# Patient Record
Sex: Female | Born: 1955 | Race: Black or African American | Hispanic: No | Marital: Married | Smoking: Current every day smoker
Health system: Southern US, Community
[De-identification: ages and names within clinical notes are randomized; demographics above are authoritative.]

## PROBLEM LIST (undated history)

## (undated) DIAGNOSIS — M069 Rheumatoid arthritis, unspecified: Secondary | ICD-10-CM

## (undated) HISTORY — PX: TUBAL LIGATION: SHX77

---

## 1987-07-11 HISTORY — PX: VAGINAL HYSTERECTOMY: SUR661

## 2009-07-29 ENCOUNTER — Encounter (INDEPENDENT_AMBULATORY_CARE_PROVIDER_SITE_OTHER): Payer: Self-pay | Admitting: *Deleted

## 2009-07-29 ENCOUNTER — Ambulatory Visit: Payer: Self-pay | Admitting: Family Medicine

## 2009-07-29 DIAGNOSIS — R5381 Other malaise: Secondary | ICD-10-CM | POA: Insufficient documentation

## 2009-07-29 DIAGNOSIS — F17200 Nicotine dependence, unspecified, uncomplicated: Secondary | ICD-10-CM

## 2009-07-29 DIAGNOSIS — M2550 Pain in unspecified joint: Secondary | ICD-10-CM | POA: Insufficient documentation

## 2009-07-29 DIAGNOSIS — R5383 Other fatigue: Secondary | ICD-10-CM

## 2009-07-29 DIAGNOSIS — J45991 Cough variant asthma: Secondary | ICD-10-CM

## 2009-07-30 ENCOUNTER — Encounter: Payer: Self-pay | Admitting: Family Medicine

## 2009-07-30 LAB — CONVERTED CEMR LAB
ALT: 13 units/L (ref 0–35)
AST: 17 units/L (ref 0–37)
Albumin: 4.1 g/dL (ref 3.5–5.2)
Alkaline Phosphatase: 116 units/L (ref 39–117)
Basophils Absolute: 0 10*3/uL (ref 0.0–0.1)
Bilirubin, Direct: 0.1 mg/dL (ref 0.0–0.3)
CO2: 27 meq/L (ref 19–32)
Calcium: 9.2 mg/dL (ref 8.4–10.5)
Cholesterol: 194 mg/dL (ref 0–200)
Creatinine, Ser: 0.6 mg/dL (ref 0.4–1.2)
Eosinophils Absolute: 0.2 10*3/uL (ref 0.0–0.7)
GFR calc non Af Amer: 134.2 mL/min (ref >60)
HCT: 41.4 % (ref 36.0–46.0)
HDL: 35.9 mg/dL — ABNORMAL LOW (ref >39.00)
Hemoglobin: 13.3 g/dL (ref 12.0–15.0)
Lymphocytes Relative: 18.3 % (ref 12.0–46.0)
Lymphs Abs: 1.9 10*3/uL (ref 0.7–4.0)
MCHC: 32.1 g/dL (ref 30.0–36.0)
Monocytes Relative: 7.1 % (ref 3.0–12.0)
Neutro Abs: 7.4 10*3/uL (ref 1.4–7.7)
Platelets: 412 10*3/uL — ABNORMAL HIGH (ref 150.0–400.0)
RDW: 12.3 % (ref 11.5–14.6)
Rhuematoid fact SerPl-aCnc: 40.2 intl units/mL — ABNORMAL HIGH (ref 0.0–20.0)
Sodium: 138 meq/L (ref 135–145)
Total CHOL/HDL Ratio: 5
Total Protein: 8 g/dL (ref 6.0–8.3)
Triglycerides: 184 mg/dL — ABNORMAL HIGH (ref 0.0–149.0)

## 2009-08-04 ENCOUNTER — Other Ambulatory Visit: Admission: RE | Admit: 2009-08-04 | Discharge: 2009-08-04 | Payer: Self-pay | Admitting: Family Medicine

## 2009-08-04 ENCOUNTER — Ambulatory Visit: Payer: Self-pay | Admitting: Family Medicine

## 2009-08-06 ENCOUNTER — Encounter (INDEPENDENT_AMBULATORY_CARE_PROVIDER_SITE_OTHER): Payer: Self-pay | Admitting: *Deleted

## 2009-08-10 ENCOUNTER — Encounter (INDEPENDENT_AMBULATORY_CARE_PROVIDER_SITE_OTHER): Payer: Self-pay | Admitting: *Deleted

## 2009-08-10 LAB — CONVERTED CEMR LAB: Pap Smear: NEGATIVE

## 2009-08-11 ENCOUNTER — Encounter: Payer: Self-pay | Admitting: Family Medicine

## 2009-09-23 ENCOUNTER — Encounter (INDEPENDENT_AMBULATORY_CARE_PROVIDER_SITE_OTHER): Payer: Self-pay | Admitting: *Deleted

## 2009-09-24 ENCOUNTER — Ambulatory Visit: Payer: Self-pay | Admitting: Gastroenterology

## 2009-10-04 ENCOUNTER — Ambulatory Visit: Payer: Self-pay | Admitting: Gastroenterology

## 2009-10-22 ENCOUNTER — Encounter: Payer: Self-pay | Admitting: Family Medicine

## 2009-10-25 ENCOUNTER — Encounter: Payer: Self-pay | Admitting: Family Medicine

## 2009-12-02 ENCOUNTER — Encounter: Payer: Self-pay | Admitting: Family Medicine

## 2009-12-13 ENCOUNTER — Ambulatory Visit: Payer: Self-pay | Admitting: Family Medicine

## 2009-12-13 DIAGNOSIS — M069 Rheumatoid arthritis, unspecified: Secondary | ICD-10-CM

## 2009-12-13 LAB — CONVERTED CEMR LAB
AST: 17 units/L (ref 0–37)
Albumin: 4.3 g/dL (ref 3.5–5.2)
Alkaline Phosphatase: 99 units/L (ref 39–117)
BUN: 7 mg/dL (ref 6–23)
Basophils Relative: 0.4 % (ref 0.0–3.0)
CO2: 31 meq/L (ref 19–32)
Calcium: 9.2 mg/dL (ref 8.4–10.5)
Eosinophils Relative: 1.9 % (ref 0.0–5.0)
Glucose, Bld: 87 mg/dL (ref 70–99)
HCT: 40 % (ref 36.0–46.0)
Hemoglobin: 13.7 g/dL (ref 12.0–15.0)
Lymphs Abs: 2.1 10*3/uL (ref 0.7–4.0)
MCV: 88.8 fL (ref 78.0–100.0)
Monocytes Absolute: 0.8 10*3/uL (ref 0.1–1.0)
Monocytes Relative: 10.8 % (ref 3.0–12.0)
Platelets: 350 10*3/uL (ref 150.0–400.0)
RBC: 4.51 M/uL (ref 3.87–5.11)
Sodium: 142 meq/L (ref 135–145)
TSH: 1.97 microintl units/mL (ref 0.35–5.50)
Total Protein: 6.9 g/dL (ref 6.0–8.3)
WBC: 7.4 10*3/uL (ref 4.5–10.5)

## 2010-01-31 ENCOUNTER — Encounter: Payer: Self-pay | Admitting: Family Medicine

## 2010-02-28 ENCOUNTER — Telehealth: Payer: Self-pay | Admitting: Family Medicine

## 2010-03-01 ENCOUNTER — Ambulatory Visit: Payer: Self-pay | Admitting: Family Medicine

## 2010-03-01 DIAGNOSIS — J019 Acute sinusitis, unspecified: Secondary | ICD-10-CM | POA: Insufficient documentation

## 2010-06-13 ENCOUNTER — Ambulatory Visit: Payer: Self-pay | Admitting: Family Medicine

## 2010-06-13 DIAGNOSIS — F323 Major depressive disorder, single episode, severe with psychotic features: Secondary | ICD-10-CM

## 2010-08-09 NOTE — Letter (Signed)
Summary: [facility] Instructions  [facility]  [address] [place], [place] [address]   Phone: [phone]  Fax: [phone]       Brittney Miles    01/09/1956    MRN: [id number]        Procedure Day /Date:  Monday 10/04/2009     Arrival Time: 8:30 am      Procedure Time: 9:30 am     Location of Procedure:                    _x _  [facility] (4th Floor)                        PREPARATION FOR COLONOSCOPY WITH MOVIPREP   Starting 5 days prior to your procedure Wednesday 3/23 do not eat nuts, seeds, popcorn, corn, beans, peas,  salads, or any raw vegetables.  Do not take any fiber supplements (e.g. Metamucil, Citrucel, and Benefiber).  THE DAY BEFORE YOUR PROCEDURE         DATE: Sunday 3/27  1.  Drink clear liquids the entire day-NO SOLID FOOD  2.  Do not drink anything colored red or purple.  Avoid juices with pulp.  No orange juice.  3.  Drink at least 64 oz. (8 glasses) of fluid/clear liquids during the day to prevent dehydration and help the prep work efficiently.  CLEAR LIQUIDS INCLUDE: Water Jello Ice Popsicles Tea (sugar ok, no milk/cream) Powdered fruit flavored drinks Coffee (sugar ok, no milk/cream) Gatorade Juice: apple, white grape, white cranberry  Lemonade Clear bullion, consomm, broth Carbonated beverages (any kind) Strained chicken noodle soup Hard Candy                             4.  In the morning, mix first dose of MoviPrep solution:    Empty 1 Pouch A and 1 Pouch B into the disposable container    Add lukewarm drinking water to the top line of the container. Mix to dissolve    Refrigerate (mixed solution should be used within 24 hrs)  5.  Begin drinking the prep at 5:00 p.m. The MoviPrep container is divided by 4 marks.   Every 15 minutes drink the solution down to the next mark (approximately 8 oz) until the full liter is complete.   6.  Follow completed prep with 16 oz of clear liquid of your choice (Nothing  red or purple).  Continue to drink clear liquids until bedtime.  7.  Before going to bed, mix second dose of MoviPrep solution:    Empty 1 Pouch A and 1 Pouch B into the disposable container    Add lukewarm drinking water to the top line of the container. Mix to dissolve    Refrigerate  THE DAY OF YOUR PROCEDURE      DATE: Monday 3/28  Beginning at 4:30 a.m. (5 hours before procedure):         1. Every 15 minutes, drink the solution down to the next mark (approx 8 oz) until the full liter is complete.  2. Follow completed prep with 16 oz. of clear liquid of your choice.    3. You may drink clear liquids until 7:30 am (2 HOURS BEFORE PROCEDURE).   MEDICATION INSTRUCTIONS  Unless otherwise instructed, you should take regular prescription medications with a small sip of water   as early as possible the morning of  your procedure.           OTHER INSTRUCTIONS  You will need a responsible adult at least 55 years of age to accompany you and drive you home.   This person must remain in the waiting room during your procedure.  Wear loose fitting clothing that is easily removed.  Leave jewelry and other valuables at home.  However, you may wish to bring a book to read or  an iPod/MP3 player to listen to music as you wait for your procedure to start.  Remove all body piercing jewelry and leave at home.  Total time from sign-in until discharge is approximately 2-3 hours.  You should go home directly after your procedure and rest.  You can resume normal activities the  day after your procedure.  The day of your procedure you should not:   Drive   Make legal decisions   Operate machinery   Drink alcohol   Return to work  You will receive specific instructions about eating, activities and medications before you leave.    The above instructions have been reviewed and explained to me by   Ezra Sites RN  September 24, 2009 3:13 PM     I fully understand and can  verbalize these instructions _____________________________ Date _________

## 2010-08-09 NOTE — Letter (Signed)
Summary: Results Follow up Letter  [place] [facility]  [address] [place], [place] [address]   Phone: [phone]  Fax: [phone]    12/13/2009 MRN: [id number]  [facility] [address] [place], [place]  [address]  Dear Brittney Miles,  The following are the results of your recent test(s):  Test         Result    Pap Smear:        Normal _____  Not Normal _____ Comments: ______________________________________________________ Cholesterol: LDL(Bad cholesterol):         Your goal is less than:         HDL (Good cholesterol):       Your goal is more than: Comments:  ______________________________________________________ Mammogram:        Normal _____  Not Normal _____ Comments:  ___________________________________________________________________ Hemoccult:        Normal _____  Not normal _______ Comments:    _____________________________________________________________________ Other Tests: Brittney Miles your labs looked great!  You do not have anemia. Your kidney, liver, and thyroid functions are all normal, very reassuring!      We routinely do not discuss normal results over the telephone.  If you desire a copy of the results, or you have any questions about this information we can discuss them at your next office visit.   Sincerely,       Ruthe Mannan, MD

## 2010-08-09 NOTE — Letter (Signed)
Summary: Results Follow up Letter  [place] [facility]  [address] [place], [place] [address]   Phone: [phone]  Fax: [phone]    08/10/2009 MRN: [id number]    [facility] [address] [place], [place]  [address]    Dear Ms. Pascale,  The following are the results of your recent test(s):  Test         Result    Pap Smear:        Normal __X___  Not Normal _____ Comments: ______________________________________________________ Cholesterol: LDL(Bad cholesterol):         Your goal is less than:         HDL (Good cholesterol):       Your goal is more than: Comments:  ______________________________________________________ Mammogram:        Normal _____  Not Normal _____ Comments:  ___________________________________________________________________ Hemoccult:        Normal _____  Not normal _______ Comments:    _____________________________________________________________________ Other Tests:    We routinely do not discuss normal results over the telephone.  If you desire a copy of the results, or you have any questions about this information we can discuss them at your next office visit.   Sincerely,    Ruthe Mannan,  M.D.  TA:lsf

## 2010-08-09 NOTE — Assessment & Plan Note (Signed)
Summary: CPX AND PAP SMEAR   Vital Signs:  Patient profile:   55 year old female Height:      62.5 inches Weight:      133 pounds BMI:     24.02 Temp:     98.7 degrees F oral Pulse rate:   84 / minute Pulse rhythm:   regular BP sitting:   108 / 78  (left arm) Cuff size:   regular  Vitals Entered By: Delilah Shan CMA Duncan Dull) (August 04, 2009 9:19 AM) CC: CPX and Pap   History of Present Illness: 55 yo here for CPE/pap.  Polyarticular joint pain and swelling- started in November.  First MP joint in right hand started to swell, very tender to palpation.  sometimes warm to touch.  Then felt her left elbow swell, very painful with movement, sometimes feels like it's locked.  Very tender to touch.  Arthritis runs in her family, she is unsure what type.  Sometimes right wrist swells as well.  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  Drew labs at first visit last week, RA 40, SED rate 31, uric acid normal.  Started on Mobic, which has helped.  Refered to rhematology.  Tobacco abuse- has smoked 1 ppd x 30 years.  Has never quit but has cut back to 1/2 ppd before.  Really wants to quit now for her grandchildren but she not quite yet ready.  Well Woman- FLP at last office visit. mammogram and colonoscopy both set up for February. G5P5.  Has never had a h/o abnormal pap smears.  Had a partial hyesterectomy 20 years ago for fibroids, has her ovaries but unsure if she still has a cervix.  No longer sexually active with her husband.  Current Medications (verified): 1)  Mobic 15 Mg Tabs (Meloxicam) .Marland Kitchen.. 1 Tab By Mouth Daily. 2)  Advair Diskus 100-50 Mcg/dose Misc (Fluticasone-Salmeterol) .Marland Kitchen.. 1 Puff 2 Times Daily 3)  Ventolin Hfa 108 (90 Base) Mcg/act Aers (Albuterol Sulfate) .... 2 Puff Every 4-6 Hours As Needed Cough/wheeze  Allergies: 1)  ! Penicillin  Past History:  Past Surgical History: Last updated: 08/09/2009 Hysterectomy  partial 1989 Tubal ligation  Family History: Last updated: 08/09/2009 Dad died of MI at 64 Mom alives, has DM, CAD s/p MI at age 76 Sister- ovarian CA at 32  Social History: Last updated: Aug 09, 2009 Moved here from [place]. 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 Moved here from [place]. 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 ENT:  Denies difficulty swallowing. CV:  Denies chest pain or discomfort. Resp:  Denies cough. GI:  Denies abdominal pain and bloody stools. GU:  Denies abnormal vaginal bleeding, discharge, dysuria, genital sores, and hematuria. MS:  Complains of joint pain and joint swelling; denies loss of strength. Derm:  Denies rash. Psych:  Denies anxiety and depression.  Physical Exam  General:  alert and well-developed, NAD.   Eyes:  No corneal or conjunctival inflammation noted. EOMI. Perrla. Funduscopic exam benign, without hemorrhages, exudates or papilledema. Vision grossly normal. Mouth:  Oral mucosa and oropharynx without lesions or exudates.  Teeth in good repair. Neck:  No deformities, masses, or tenderness noted. Breasts:  No mass, nodules, thickening, tenderness, bulging, retraction, inflamation, nipple discharge or skin changes  noted.   Lungs:  normal respiratory effort and no intercostal retractions, scattered occassional exp wheezes, no crackles.   Heart:  Normal rate and regular rhythm. S1 and S2 normal without gallop, murmur, click, rub or other extra sounds. Abdomen:  Bowel sounds positive,abdomen soft and non-tender without masses, organomegaly or hernias noted. Genitalia:  Pelvic Exam:        External: normal female genitalia without lesions or masses        Vagina: normal without lesions or masses        No cervix        Adnexa: normal bimanual  exam without masses or fullness        Uterus: normal by palpation        Pap smear: performed Msk:  sill has limited mobitlity of left elbow, shoulder, seem a little less swollen. Bilateral hands (right>left) still with swollen and tender PIP.  No erythema. Psych:  Cognition and judgment appear intact. Alert and cooperative with normal attention span and concentration. No apparent delusions, illusions, hallucinations    Impression & Recommendations:  Problem # 1:  Preventive Health Care (ICD-V70.0) Reviewed preventive care protocols, scheduled due services, and updated immunizations Discussed nutrition, exercise, diet, and healthy lifestyle.  UTD on all prevention at this point (mammogram and colonscopy scheduled).  Problem # 2:  SCREENING FOR MALIGNANT NEOPLASM OF THE CERVIX (ICD-V76.2) Assessment: New Pap today, has no cervix, explained that we only have to do pelvic exams, not pap smears at this point. 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  Complete Medication List: 1)  Mobic 15 Mg Tabs (Meloxicam) .Marland Kitchen.. 1 tab by mouth daily. 2)  Advair Diskus 100-50 Mcg/dose Misc (Fluticasone-salmeterol) .Marland Kitchen.. 1 puff 2 times daily 3)  Ventolin Hfa 108 (90 Base) Mcg/act Aers (Albuterol sulfate) .... 2 puff every 4-6 hours as needed cough/wheeze  Current Allergies (reviewed today): ! PENICILLIN

## 2010-08-09 NOTE — Miscellaneous (Signed)
Summary: Orders Update  Clinical Lists Changes  Orders: Added new Referral order of Rheumatology Referral (Rheumatology) - Signed 

## 2010-08-09 NOTE — Letter (Signed)
Summary: [facility]   Imported By: Lanelle Bal 12/31/2009 10:13:44  _____________________________________________________________________  External Attachment:    Type:   Image     Comment:   External Document

## 2010-08-09 NOTE — Assessment & Plan Note (Signed)
Summary: DEPRESSION/CLE   Vital Signs:  Patient profile:   55 year old female Height:      62.5 inches Weight:      143.50 pounds BMI:     25.92 Temp:     97.7 degrees F oral Pulse rate:   80 / minute Pulse rhythm:   regular BP sitting:   120 / 80  (left arm) Cuff size:   regular  Vitals Entered By: Linde Gillis CMA Duncan Dull) (June 13, 2010 2:51 PM) CC: depression   History of Present Illness: 55 yo very pleasant female with RA here with her husband to discuss years of worsening depression.  Since 2009, very tearful.  Has days where she will stay in bed for over 24 hours. Also reluctantly admits to seeing shadows of people.  She has seen them for so many years that they no longer scare her.  One she can see very vividly, a man with a belly wearing blue jeans. They never tell her harm anyone but she does separately hear whispering voices.  She usually cannot make out what they are saying. Has not SI or HI but she is "tired" and has often hoped she would die in her sleep so this would stop.  Recently has started doing repetitive things as well.  If she is counting something and it ends on an odd number, she has to start over.  Denies any manic symptoms.  Her daughter and aunt have schizophrenia.   Current Medications (verified): 1)  Advair Diskus 100-50 Mcg/dose Misc (Fluticasone-Salmeterol) .Marland Kitchen.. 1 Puff 2 Times Daily 2)  Ventolin Hfa 108 (90 Base) Mcg/act Aers (Albuterol Sulfate) .... 2 Puff Every 4-6 Hours As Needed Cough/wheeze 3)  Folic Acid 1 Mg Tabs (Folic Acid) .... Take One Tablet By Mouth Daily 4)  Methotrexate Sodium 25 Mg/ml Soln (Methotrexate Sodium) .... Inject 0.8cc/ml Under The Skin Once Every Week 5)  Tessalon Perles 100 Mg Caps (Benzonatate) .Marland Kitchen.. 1 Tab By Mouth Three Times A Day As Needed Cough  Allergies: 1)  ! Penicillin 2)  ! 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  Physical Exam  General:  Well-developed,well-nourished,in no acute distress; alert,appropriate and cooperative throughout examination VSS, non toxic appearing Psych:  Cognition and judgment appear intact. Alert and cooperative with normal attention span and concentration. 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 Several issues going on but I am concerned for schizophrenia and psychotic behavior. Offered to start on medication but patient would prefer to be referred to psychiatry.  She is contracted for safety.  Orders: Psychiatric Referral (Psych)  Complete Medication List:  1)  Advair Diskus 100-50 Mcg/dose Misc (Fluticasone-salmeterol) .Marland Kitchen.. 1 puff 2 times daily 2)  Ventolin Hfa 108 (90 Base) Mcg/act Aers (Albuterol sulfate) .... 2 puff every 4-6 hours as needed cough/wheeze 3)  Folic Acid 1 Mg Tabs (Folic acid) .... Take one tablet by mouth daily 4)  Methotrexate Sodium 25 Mg/ml Soln (Methotrexate sodium) .... Inject 0.8cc/ml under the skin once every week 5)  Tessalon Perles 100 Mg Caps  (Benzonatate) .Marland Kitchen.. 1 tab by mouth three times a day as needed cough  Patient Instructions: 1)  Please see Shirlee Limerick on your way out.   Orders Added: 1)  Psychiatric Referral [Psych] 2)  Est. Patient Level IV [57846]    Current Allergies (reviewed today): ! PENICILLIN ! SULFA

## 2010-08-09 NOTE — Letter (Signed)
Summary: [facility]   Imported By: Lanelle Bal 11/04/2009 13:06:48  _____________________________________________________________________  External Attachment:    Type:   Image     Comment:   External Document

## 2010-08-09 NOTE — Letter (Signed)
Summary: [facility]   Imported By: Lanelle Bal 02/11/2010 13:45:23  _____________________________________________________________________  External Attachment:    Type:   Image     Comment:   External Document

## 2010-08-09 NOTE — Progress Notes (Signed)
Summary: Cold symptoms  Phone Note Call from Patient Call back at [phone]   Caller: Spouse/Stacey Call For: Ruthe Mannan MD Summary of Call: Patient has an appointment scheduled with you tomorrow for cold symptoms. Patient has a cold with productive cough and fever. Patient wants to know what she should take OTC until she sees you tomorrow? Patient called her rheumotologist today and they suggested that she contact you. [facility] Initial call taken by: Sydell Axon LPN,  February 28, 2010 11:22 AM  Follow-up for Phone Call        She can take OTC products like Mucinex. Ruthe Mannan MD  February 28, 2010 11:25 AM  Spoke with patient and she says that she has never taken Mucinex because she tries to stay away from a lot of OTC medications due to RA.  She can take or has taken in the past Robitussin and Vicks 44.  Please advise.  Linde Gillis CMA Duncan Dull)  February 28, 2010 11:50 AM   Additional Follow-up for Phone Call Additional follow up Details #1::        thats ok too. Ruthe Mannan MD  February 28, 2010 11:51 AM  Patient advised as instructed via telephone.  Additional Follow-up by: Linde Gillis CMA Duncan Dull),  February 28, 2010 11:55 AM

## 2010-08-09 NOTE — Assessment & Plan Note (Signed)
Summary: feeling tired, hot flashes   Vital Signs:  Patient profile:   55 year old female Height:      62.5 inches Weight:      137.50 pounds BMI:     24.84 Temp:     98.5 degrees F oral Pulse rate:   68 / minute Pulse rhythm:   regular BP sitting:   110 / 72  (left arm) Cuff size:   regular  Vitals Entered By: Linde Gillis CMA Duncan Dull) (December 13, 2009 8:43 AM) CC: feeling tired, hot flashes   History of Present Illness: 55 yo here for feeling tired, sometimes have chills and sweats.  Has been post menopausal since 1999. Has not felt this way in years.  Started shortly after starting Methotrexate for newly diagnosed RA. Sees Dr. Dareen Piano, joints much less swollen.  Can actually move her elbows! On Methotrexate 25 mg/ml 0.8 cc/ml weekly subq.  Just saw Dr. Dareen Piano last week, sees him every 6 weeks.   Current Medications (verified): 1)  Advair Diskus 100-50 Mcg/dose Misc (Fluticasone-Salmeterol) .Marland Kitchen.. 1 Puff 2 Times Daily 2)  Ventolin Hfa 108 (90 Base) Mcg/act Aers (Albuterol Sulfate) .... 2 Puff Every 4-6 Hours As Needed Cough/wheeze 3)  Folic Acid 1 Mg Tabs (Folic Acid) .... Take One Tablet By Mouth Daily 4)  Methotrexate Sodium 25 Mg/ml Soln (Methotrexate Sodium) .... Inject 0.8cc/ml Under The Skin Once Every Week  Allergies: 1)  ! Penicillin 2)  ! Sulfa  Review of Systems      See HPI General:  Denies fever, weakness, and weight loss. CV:  Denies chest pain or discomfort, difficulty breathing at night, fainting, and fatigue. GI:  Denies diarrhea, nausea, and vomiting. MS:  Denies joint pain, joint redness, joint swelling, and loss of strength.  Physical Exam  General:  Well-developed,well-nourished,in no acute distress; alert,appropriate and cooperative throughout examination Mouth:  Oral mucosa and oropharynx without lesions or exudates.  Teeth in good repair. Lungs:  normal respiratory effort and no intercostal retractions, scattered occassional exp wheezes, no  crackles.   Heart:  Normal rate and regular rhythm. S1 and S2 normal without gallop, murmur, click, rub or other extra sounds. Msk:  FROM in  left elbow, shoulder! No swelling in hands, no erythema!  Psych:  Cognition and judgment appear intact. Alert and cooperative with normal attention span and concentration. No apparent delusions, illusions, hallucinations   Impression & Recommendations:  Problem # 1:  FATIGUE (ICD-780.79) Assessment Deteriorated Time spent with patient 25 minutes, more than 50% of this time was spent counseling patient on possible causes of her fatigue.   RA is known for causing extreme fatigue.  She is also on Methotrexate which can cause many of the symptoms she described.  She was afraid to read about the side effects, we discussed them together.  Will get lab work to rule out other reversible causes.  I have also asked her to make sure Dr. Dareen Piano is sending [place] records.  Orders: Venipuncture ([address]) TLB-CBC Platelet - w/Differential (85025-CBCD) TLB-BMP (Basic Metabolic Panel-BMET) (80048-METABOL) TLB-Hepatic/Liver Function Pnl (80076-HEPATIC) TLB-TSH (Thyroid Stimulating Hormone) (84443-TSH)  Complete Medication List: 1)  Advair Diskus 100-50 Mcg/dose Misc (Fluticasone-salmeterol) .Marland Kitchen.. 1 puff 2 times daily 2)  Ventolin Hfa 108 (90 Base) Mcg/act Aers (Albuterol sulfate) .... 2 puff every 4-6 hours as needed cough/wheeze 3)  Folic Acid 1 Mg Tabs (Folic acid) .... Take one tablet by mouth daily 4)  Methotrexate Sodium 25 Mg/ml Soln (Methotrexate sodium) .... Inject 0.8cc/ml under the  skin once every week  Current Allergies (reviewed today): ! PENICILLIN ! SULFA

## 2010-08-09 NOTE — Assessment & Plan Note (Signed)
Summary: NEW PATIENT TO EST/MK   Vital Signs:  Patient profile:   55 year old female Height:      62.5 inches Weight:      133.50 pounds BMI:     24.12 Temp:     98.5 degrees F oral Pulse rate:   84 / minute Pulse rhythm:   regular BP sitting:   130 / 74  (left arm) Cuff size:   regular  Vitals Entered By: Brittney Miles CMA Duncan Dull) (August 26, 2009 10:58 AM) CC: New Patient to Establish   History of Present Illness: 55 yo here to establish care.  Polyarticular joint pain and swelling- started in November.  First MP joint in right hand started to swell, very tender to palpation.  sometimes warm to touch.  Then felt her left elbow swell, very painful with movement, sometimes feels like it's locked.  Very tender to touch.  Arthritis runs in her family, she is unsure what type.  Sometimes right wrist swells as well.  Fatigue- started about the same time as the joints started swelling (05/2009).  Feels completely tired all the time, diffuclty sleeping as well.  Denies any symptoms of depression but sometimes gets anxious about feeling so tired.  No LE edema, no SOB, no CP.  Has had issues with constipation.  No blood in stool, no abdominal pain.  Tobacco abuse- has smoked 1 ppd x 30 years.  Has never quit but has cut back to 1/2 ppd before.  Really wants to quit now for her grandchildren but she not quite yet ready.  Cough- chronic, worsened by being in cold or exertion.  No wheezing.  No SOB.  Often has a deep morning cough, ongoing for 15 years or so.  Well Woman- needs FLP, Pap, mammogram.    Current Medications (verified): 1)  Mobic 15 Mg Tabs (Meloxicam) .Marland Kitchen.. 1 Tab By Mouth Daily. 2)  Advair Diskus 100-50 Mcg/dose Misc (Fluticasone-Salmeterol) .Marland Kitchen.. 1 Puff 2 Times Daily 3)  Ventolin Hfa 108 (90 Base) Mcg/act Aers (Albuterol Sulfate) .... 2 Puff Every 4-6 Hours As Needed Cough/wheeze  Allergies (verified): 1)  ! Penicillin  Past History:  Family History: Last updated:  August 26, 2009 Dad died of MI at 36 Mom alives, has DM, CAD s/p MI at age 74 Sister- ovarian CA at 3  Social History: Last updated: 08/26/09 Moved here from [place]. 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 Eyes:  Denies blurring. ENT:  Denies difficulty swallowing. CV:  Denies chest pain or discomfort, difficulty breathing at night, difficulty breathing while lying down, fainting, lightheadness, palpitations, and shortness of breath with exertion. Resp:  Complains of cough and sputum productive; denies shortness of breath and wheezing. GI:  Complains of constipation; denies abdominal pain, bloody stools, loss of appetite, nausea, and vomiting. MS:  Complains of joint pain, joint redness, and joint swelling; denies loss of strength. Derm:  Complains of dryness; denies hair loss and rash. Psych:  Complains of anxiety; denies  depression, easily angered, easily tearful, irritability, mental problems, panic attacks, sense of great danger, suicidal thoughts/plans, and thoughts of violence. Endo:  Complains of cold intolerance; denies heat intolerance and weight change. Heme:  Denies abnormal bruising, bleeding, enlarge lymph nodes, fevers, and pallor.  Physical Exam  General:  Well-developed,well-nourished,in no acute distress; alert,appropriate and cooperative throughout examination Eyes:  No corneal or conjunctival inflammation noted. EOMI. Perrla. Funduscopic exam benign, without hemorrhages,  exudates or papilledema. Vision grossly normal. Ears:  External ear exam shows no significant lesions or deformities.  Otoscopic examination reveals clear canals, tympanic membranes are intact bilaterally without bulging, retraction, inflammation or discharge. Hearing is grossly normal bilaterally. Mouth:  Oral mucosa and oropharynx without lesions or exudates.  Teeth in good repair. Neck:  No deformities, masses, or tenderness noted. Lungs:  normal respiratory effort and no intercostal retractions, scattered occassional exp wheezes, no crackles.   Heart:  Normal rate and regular rhythm. S1 and S2 normal without gallop, murmur, click, rub or other extra sounds. Abdomen:  Bowel sounds positive,abdomen soft and non-tender without masses, organomegaly or hernias noted. Skin:  Intact without suspicious lesions or rashes Psych:  Cognition and judgment appear intact. Alert and cooperative with normal attention span and concentration. No apparent delusions, illusions, hallucinations   Shoulder/Elbow Exam  Elbow Exam:    Left:    Inspection:  Abnormal    Palpation:  Abnormal       Location:  right lateral epicondyle    Stability:  stable    Tenderness:  right lateral epicondyle    Swelling:  right lateral epicondyle    Erythema:  right lateral epicondyle   Wrist/Hand Exam  Wrist Exam:    Right:    Inspection:  Abnormal    Palpation:  Abnormal       Location:  left radial head    Stability:  stable    Tenderness:  right radial head    Swelling:  right radial head    Erythema:  right radial head  Hand Exam:    Right:    Inspection:  Abnormal    Palpation:  Abnormal       Location:  1st MCPJ    Tenderness:  1st MCPJ    Swelling:  1st MCPJ    Erythema:  1st MCPJ   Impression & Recommendations:  Problem # 1:  POLYARTHRITIS (ICD-719.49) Assessment New Differential is very wide.  Most likely RA.  Will check uric acid, SED rate, RA factor.  May need referral to ortho/rheum . Time  spent with patient 45 minutes, more than 50% of this time was discussing possible eitologies and further work up.  Pt is very anxious that this may be a progressive illness.  Meloxicam for pain.  Orders: Venipuncture (51761) TLB-Rheumatoid Factor (RA) (60737-TG) TLB-Sedimentation Rate (ESR) (85652-ESR) TLB-Uric Acid, Blood (84550-URIC)  Problem # 2:  FATIGUE (ICD-780.79) Assessment: New Likely related to #1.  Will check CBC, BMET, TSH, B12/Folate to r/o other reversible causes. Orders: Venipuncture (62694) TLB-CBC Platelet - w/Differential (85025-CBCD) TLB-BMP (Basic Metabolic Panel-BMET) (80048-METABOL) TLB-TSH (Thyroid Stimulating Hormone) (84443-TSH) TLB-B12 + Folate Pnl (85462_70350-K93/GHW)  Problem # 3:  TOBACCO ABUSE (ICD-305.1) Assessment: Unchanged Precontemplative stage.  Will continue to discuss with patient.  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   Strongly advised quitting smoking.  given handout on Chantix. Will start Advair daily, along with rescue inhaler.  Schedule PFTs. Her  updated medication list for this problem includes:    Advair Diskus 100-50 Mcg/dose Misc (Fluticasone-salmeterol) .Marland Kitchen... 1 puff 2 times daily    Ventolin Hfa 108 (90 Base) Mcg/act Aers (Albuterol sulfate) .Marland Kitchen... 2 puff every 4-6 hours as needed cough/wheeze  Problem # 5:  OTHER SCREENING MAMMOGRAM (ICD-V76.12) Assessment: Comment Only schedule mammogram today. Orders: Radiology Referral (Radiology)  Problem # 6:  SPECIAL SCREENING FOR MALIGNANT NEOPLASMS COLON (ICD-V76.51) Assessment: Comment Only schedule colonoscopy today. Orders: Radiology Referral (Radiology)  Complete Medication List: 1)  Mobic 15 Mg Tabs (Meloxicam) .Marland Kitchen.. 1 tab by mouth daily. 2)  Advair Diskus 100-50 Mcg/dose Misc (Fluticasone-salmeterol) .Marland Kitchen.. 1 puff 2 times daily 3)  Ventolin Hfa 108 (90 Base)  Mcg/act Aers (Albuterol sulfate) .... 2 puff every 4-6 hours as needed cough/wheeze  Other Orders: TLB-Lipid Panel (80061-LIPID) TLB-Hepatic/Liver Function Pnl (80076-HEPATIC)  Patient Instructions: 1)  Very nice to meet you, Brittney Miles. 2)  Please stop by to see Shirlee Limerick on your way out to set up your colonscopy and mammogram. 3)  Make an appointment for a complete physical pap smear on your way out. 4)  I will call you with your lab results either tomorrow or Monday. Prescriptions: VENTOLIN HFA 108 (90 BASE) MCG/ACT AERS (ALBUTEROL SULFATE) 2 puff every 4-6 hours as needed cough/wheeze  #1 x 0   Entered and Authorized by:   Ruthe Mannan MD   Signed by:   Ruthe Mannan MD on 07/29/2009   Method used:   Electronically to        [facility]. [address] [phone]* (retail)       [address] [place], [place]  [address]       Ph: [phone]       Fax: [phone]   RxID:   [id number] ADVAIR DISKUS 100-50 MCG/DOSE MISC (FLUTICASONE-SALMETEROL) 1 puff 2 times daily  #1 x 0   Entered and Authorized by:   Ruthe Mannan MD   Signed by:   Ruthe Mannan MD on 07/29/2009   Method used:   Electronically to        [facility]. [address] [phone]* (retail)       [address], [place]  [address]       Ph: [phone]       Fax: [phone]   RxID:   [id number] MOBIC 15 MG TABS (MELOXICAM) 1 tab by mouth daily.  #30 x 0   Entered and Authorized by:   Ruthe Mannan MD   Signed by:   Ruthe Mannan MD on 07/29/2009   Method used:   Electronically to        [facility]. [address] [phone]* (retail)       [address], [place]  [address]       Ph: [phone]       Fax: [phone]   RxID:   [id number]   Prior Medications (reviewed today): None Current Allergies (reviewed today): ! PENICILLIN  TD Result Date:  07/25/2007 TD Result:  historical TD Next Due:  10 yr    Past Medical History:    Unremarkable  Past Surgical History:    Hysterectomy partial 1989     Tubal ligation

## 2010-08-09 NOTE — Letter (Signed)
Summary: Results Follow up Letter  [place] [facility]  [address] [place], [place] [address]   Phone: [phone]  Fax: [phone]    08/06/2009 MRN: [id number]    [facility] [address] [place], [place]  [address]    Dear Ms. Harriott,  The following are the results of your recent test(s):  Test         Result    Pap Smear:        Normal __X___  Not Normal _____ Comments:   Repeat in 1-2 years. ______________________________________________________ Cholesterol: LDL(Bad cholesterol):         Your goal is less than:         HDL (Good cholesterol):       Your goal is more than: Comments:  ______________________________________________________ Mammogram:        Normal _____  Not Normal _____ Comments:  ___________________________________________________________________ Hemoccult:        Normal _____  Not normal _______ Comments:    _____________________________________________________________________ Other Tests:    We routinely do not discuss normal results over the telephone.  If you desire a copy of the results, or you have any questions about this information we can discuss them at your next office visit.   Sincerely,    Ruthe Mannan,  M.D.  TA:lsf

## 2010-08-09 NOTE — Letter (Signed)
Summary: Previsit letter  [facility]  [address] [place], [place] [address]   Phone: [phone]  Fax: [phone]       07/29/2009 MRN: [id number]  [facility] [address] [place], [place]  [address]  Dear Ms. Brittney Miles,  Welcome to the Gastroenterology Division at [facility].    You are scheduled to see a nurse for your pre-procedure visit on 08-17-09 at 1:30pm on the 3rd floor at [facility], [address]  We ask that you try to arrive at our office 15 minutes prior to your appointment time to allow for check-in.  Your nurse visit will consist of discussing your medical and surgical history, your immediate family medical history, and your medications.    Please bring a complete list of all your medications or, if you prefer, bring the medication bottles and we will list them.  We will need to be aware of both prescribed and over the counter drugs.  We will need to know exact dosage information as well.  If you are on blood thinners (Coumadin, Plavix, Aggrenox, Ticlid, etc.) please call our office today/prior to your appointment, as we need to consult with your physician about holding your medication.   Please be prepared to read and sign documents such as consent forms, a financial agreement, and acknowledgement forms.  If necessary, and with your consent, a friend or relative is welcome to sit-in on the nurse visit with you.  Please bring your insurance card so that we may make a copy of it.  If your insurance requires a referral to see a specialist, please bring your referral form from your primary care physician.  No co-pay is required for this nurse visit.     If you cannot keep your appointment, please call [phone] to cancel or reschedule prior to your appointment date.  This allows [place] the opportunity to schedule an appointment for another patient in need of care.    Thank you for choosing [facility] for your medical  needs.  We appreciate the opportunity to care for you.  Please visit [place] at our website  to learn more about our practice.                     Sincerely.                                                                                                                   The Gastroenterology Division

## 2010-08-09 NOTE — Miscellaneous (Signed)
Summary: [facility]  Clinical Lists Changes  Medications: Added new medication of MOVIPREP 100 GM  SOLR (PEG-KCL-NACL-NASULF-NA ASC-C) As per prep instructions. - Signed Rx of MOVIPREP 100 GM  SOLR (PEG-KCL-NACL-NASULF-NA ASC-C) As per prep instructions.;  #1 x 0;  Signed;  Entered by: Ezra Sites RN;  Authorized by: Rachael Fee MD;  Method used: Electronically to [facility] [address] [id number]*, [address], [place], [place]  [address], Ph: [phone], Fax: [phone] Allergies: Added new allergy or adverse reaction of SULFA    Prescriptions: MOVIPREP 100 GM  SOLR (PEG-KCL-NACL-NASULF-NA ASC-C) As per prep instructions.  #1 x 0   Entered by:   Ezra Sites RN   Authorized by:   Rachael Fee MD   Signed by:   Ezra Sites RN on 09/24/2009   Method used:   Electronically to        [facility] [address] [phone]* (retail)       [address] [place]  [address]       Ph: [phone]       Fax: [phone]   RxID:   [id number]

## 2010-08-09 NOTE — Assessment & Plan Note (Signed)
Summary: Cold, productive cough   Vital Signs:  Patient profile:   55 year old female Height:      62.5 inches Weight:      141.38 pounds BMI:     25.54 Temp:     98.7 degrees F oral Pulse rate:   68 / minute Pulse rhythm:   regular BP sitting:   130 / 70  (right arm) Cuff size:   regular  Vitals Entered By: Linde Gillis CMA Duncan Dull) (March 01, 2010 8:48 AM) CC: cold   History of Present Illness: 55 yo with h/o RA on MTX here for 1 week of progressive URI symptoms. Started with sneezing and runny nose.   Now has productive cough, sinus pressure. No fevers or SOB. More tired than usual.  Current Medications (verified): 1)  Advair Diskus 100-50 Mcg/dose Misc (Fluticasone-Salmeterol) .Marland Kitchen.. 1 Puff 2 Times Daily 2)  Ventolin Hfa 108 (90 Base) Mcg/act Aers (Albuterol Sulfate) .... 2 Puff Every 4-6 Hours As Needed Cough/wheeze 3)  Folic Acid 1 Mg Tabs (Folic Acid) .... Take One Tablet By Mouth Daily 4)  Methotrexate Sodium 25 Mg/ml Soln (Methotrexate Sodium) .... Inject 0.8cc/ml Under The Skin Once Every Week 5)  Azithromycin 250 Mg  Tabs (Azithromycin) .... 2 By  Mouth Today and Then 1 Daily For 4 Days 6)  Tessalon Perles 100 Mg Caps (Benzonatate) .Marland Kitchen.. 1 Tab By Mouth Three Times A Day As Needed Cough  Allergies: 1)  ! Penicillin 2)  ! 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 ENT:  Complains of nasal congestion, postnasal drainage, sinus pressure, and sore throat. Resp:  Complains of cough and  sputum productive; denies shortness of breath and wheezing.  Physical Exam  General:  Well-developed,well-nourished,in no acute distress; alert,appropriate and cooperative throughout examination VSS, non toxic appearing Ears:  TMs retracted bilaterally Nose:  boggy turbinates, sinuses TTP throughout Mouth:  pharyngeal erythema.   Lungs:  normal respiratory effort and no intercostal retractions, scattered occassional exp wheezes, no crackles.   Heart:  Normal rate and regular rhythm. S1 and S2 normal without gallop, murmur, click, rub or other extra sounds. Psych:  Cognition and judgment appear intact. Alert and cooperative with normal attention span and concentration. No apparent delusions, illusions, hallucinations   Impression & Recommendations:  Problem # 1:  ACUTE SINUSITIS, UNSPECIFIED (ICD-461.9) Assessment New complicated since she is potentially immunocompromised (on MTX). Treat with zpack, tessalon perles for cough. Follow up in 5-7 days of no improvement of symptoms. Her updated medication list for this problem includes:    Azithromycin 250 Mg Tabs (Azithromycin) .Marland Kitchen... 2 by  mouth today and then 1 daily for 4 days    Tessalon Perles 100 Mg Caps (Benzonatate) .Marland Kitchen... 1 tab by mouth three times a day as needed cough  Complete Medication List: 1)  Advair Diskus 100-50 Mcg/dose Misc (Fluticasone-salmeterol) .Marland Kitchen.. 1 puff 2 times daily 2)  Ventolin Hfa 108 (90 Base) Mcg/act Aers (Albuterol sulfate) .... 2 puff every 4-6 hours as needed cough/wheeze 3)  Folic Acid 1 Mg Tabs (Folic acid) .... Take one tablet by mouth daily 4)  Methotrexate Sodium 25 Mg/ml Soln (Methotrexate sodium) .... Inject 0.8cc/ml under the skin once every week 5)  Azithromycin 250 Mg Tabs (Azithromycin) .... 2 by  mouth today and then 1 daily for 4 days 6)  Tessalon Perles 100 Mg Caps (Benzonatate) .Marland Kitchen.. 1 tab by mouth three times a day as needed cough Prescriptions: TESSALON PERLES 100 MG CAPS (BENZONATATE) 1  tab by mouth three times a day as needed cough  #60 x 0   Entered and Authorized by:   Ruthe Mannan MD   Signed by:   Ruthe Mannan MD on 03/01/2010   Method used:   Electronically to        [facility]. [address] [phone]* (retail)       [address] [place], [place]  [address]       Ph: [phone]       Fax: [phone]   RxID:   [id number] AZITHROMYCIN 250 MG  TABS (AZITHROMYCIN) 2 by  mouth today and then 1 daily for 4 days  #6 x 0   Entered and Authorized by:   Ruthe Mannan MD   Signed by:   Ruthe Mannan MD on 03/01/2010   Method used:   Electronically to        [facility]. [address] [phone]* (retail)       [address], [place]  [address]       Ph: [phone]       Fax: [phone]   RxID:   [id number]   Current Allergies (reviewed today): ! PENICILLIN ! SULFA

## 2010-08-09 NOTE — Procedures (Signed)
Summary: Colonoscopy  Patient: Brittney Miles Note: All result statuses are Final unless otherwise noted.  Tests: (1) Colonoscopy (COL)   COL Colonoscopy           DONE     [facility]     [address]     [place], [place]  [address]           COLONOSCOPY PROCEDURE REPORT           PATIENT:  Brittney Miles, Brittney Miles  MR#:  [id number]     BIRTHDATE:  1955-09-07, 53 yrs. old  GENDER:  female     ENDOSCOPIST:  Rachael Fee, MD     REF. BY:  Ruthe Mannan, M.D.     PROCEDURE DATE:  10/04/2009     PROCEDURE:  Colonoscopy, Diagnostic     ASA CLASS:  Class II     INDICATIONS:  Routine Risk Screening     MEDICATIONS:   Fentanyl 75 mcg IV, Versed 8 mg IV     DESCRIPTION OF PROCEDURE:   After the risks benefits and     alternatives of the procedure were thoroughly explained, informed     consent was obtained.  Digital rectal exam was performed and     revealed no rectal masses.   The LB [id number] endoscope     was introduced through the anus and advanced to the cecum, which     was identified by both the appendix and ileocecal valve, without     limitations.  The quality of the prep was good, using MoviPrep.     The instrument was then slowly withdrawn as the colon was fully     examined.     <<PROCEDUREIMAGES>>           FINDINGS:  Mild diverticulosis was found throughout the colon (see     image3).  This was otherwise a normal examination of the colon     (see image2, image1, and image4).   Retroflexed views in the     rectum revealed no abnormalities.    The scope was then withdrawn     from the patient and the procedure completed.           COMPLICATIONS:  None     ENDOSCOPIC IMPRESSION:     1) Mild diverticulosis throughout the colon     2) Otherwise normal examination; no polyps or cancers           RECOMMENDATIONS:     1) Continue current colorectal screening recommendations for     "routine risk" patients with a repeat colonoscopy in 10 years.           REPEAT EXAM:   10 years           ______________________________     Rachael Fee, MD           n.     eSIGNED:   Rachael Fee at 10/04/2009 09:53 AM           Audry Riles, [id number]  Note: An exclamation mark (!) indicates a result that was not dispersed into the flowsheet. Document Creation Date: 10/04/2009 9:54 AM _______________________________________________________________________  (1) Order result status: Final Collection or observation date-time: 10/04/2009 09:46 Requested date-time:  Receipt date-time:  Reported date-time:  Referring Physician:   Ordering Physician: Rob Bunting [phone]) Specimen Source:  Source: Launa Grill Order Number: [id number] Lab site:   Appended Document: Colonoscopy    Clinical Lists Changes  Observations:  Added new observation of COLONNXTDUE: 09/2019 (10/04/2009 13:07)

## 2010-08-09 NOTE — Consult Note (Signed)
Summary: [facility]   Imported By: Lanelle Bal 08/24/2009 08:22:17  _____________________________________________________________________  External Attachment:    Type:   Image     Comment:   External Document

## 2010-10-24 ENCOUNTER — Encounter: Payer: Self-pay | Admitting: Family Medicine

## 2010-10-24 LAB — HM PAP SMEAR

## 2010-10-27 ENCOUNTER — Encounter: Payer: Self-pay | Admitting: Family Medicine

## 2010-10-27 ENCOUNTER — Ambulatory Visit: Payer: Self-pay | Admitting: Family Medicine

## 2010-10-27 ENCOUNTER — Ambulatory Visit (INDEPENDENT_AMBULATORY_CARE_PROVIDER_SITE_OTHER): Payer: 59 | Admitting: Family Medicine

## 2010-10-27 DIAGNOSIS — N63 Unspecified lump in unspecified breast: Secondary | ICD-10-CM

## 2010-10-27 DIAGNOSIS — M069 Rheumatoid arthritis, unspecified: Secondary | ICD-10-CM

## 2010-10-27 DIAGNOSIS — N6320 Unspecified lump in the left breast, unspecified quadrant: Secondary | ICD-10-CM | POA: Insufficient documentation

## 2010-10-27 NOTE — Assessment & Plan Note (Signed)
New.  No mass on exam today. Will get diagnostic mammogram to rule out mass. The patient indicates understanding of these issues and agrees with the plan.

## 2010-10-27 NOTE — Progress Notes (Signed)
55 yo here for left breast mass.  Noticed it last month. Left breast was diffusely swollen and tender, swelling under left arm. ?mass at 6 oclock position. Has now resolved. Noticed it got better when she missed a MTX injection for her RA. Asked Dr. Dareen Piano about it, rheumatologist who did not think it was a side effect from MTX.  Overdue for mammogram.  The PMH, PSH, Social History, Family History, Medications, and allergies have been reviewed in [facility], and have been updated if relevant.   Review of Systems       See HPI General:  Denies fever, weakness, and weight loss. CV:  Denies chest pain or discomfort, difficulty breathing at night, fainting, and fatigue. GI:  Denies diarrhea, nausea, and vomiting. MS:  Denies joint pain, joint redness, joint swelling, and loss of strength.  Physical Exam BP 130/82  Pulse 81  Temp(Src) 98.4 F (36.9 C) (Oral)  Ht 5\' 3"  (1.6 m)  Wt 143 lb 6.4 oz (65.046 kg)  BMI 25.40 kg/m2   General:  Well-developed,well-nourished,in no acute distress; alert,appropriate and cooperative throughout examination Mouth:  Oral mucosa and oropharynx without lesions or exudates.  Teeth in good repair. Lungs:  normal respiratory effort and no intercostal retractions, scattered occassional exp wheezes, no crackles.   Breast:  No palpable masses or deformities in either breast. Heart:  Normal rate and regular rhythm. S1 and S2 normal without gallop, murmur, click, rub or other extra sounds. Msk:  FROM in  left elbow, shoulder! No swelling in hands, no erythema!  Psych:  Cognition and judgment appear intact. Alert and cooperative with normal attention span and concentration. No apparent delusions, illusions, hallucinations

## 2010-10-27 NOTE — Patient Instructions (Signed)
Please stop by to see Shirlee Limerick on your out.

## 2010-10-31 ENCOUNTER — Encounter: Payer: Self-pay | Admitting: *Deleted

## 2010-11-02 ENCOUNTER — Encounter: Payer: Self-pay | Admitting: Family Medicine

## 2010-11-08 ENCOUNTER — Encounter: Payer: Self-pay | Admitting: Family Medicine

## 2010-11-15 DIAGNOSIS — F3164 Bipolar disorder, current episode mixed, severe, with psychotic features: Secondary | ICD-10-CM

## 2010-11-15 DIAGNOSIS — R413 Other amnesia: Secondary | ICD-10-CM

## 2011-09-18 ENCOUNTER — Encounter: Payer: Self-pay | Admitting: Family Medicine

## 2011-09-18 ENCOUNTER — Ambulatory Visit (INDEPENDENT_AMBULATORY_CARE_PROVIDER_SITE_OTHER): Payer: 59 | Admitting: Family Medicine

## 2011-09-18 VITALS — BP 132/80 | HR 72 | Temp 98.3°F | Wt 133.0 lb

## 2011-09-18 DIAGNOSIS — J329 Chronic sinusitis, unspecified: Secondary | ICD-10-CM

## 2011-09-18 MED ORDER — AZITHROMYCIN 250 MG PO TABS: ORAL_TABLET | ORAL | Status: AC

## 2011-09-18 MED ORDER — HYDROCODONE-HOMATROPINE 5-1.5 MG/5ML PO SYRP: 5.0000 mL | ORAL_SOLUTION | Freq: Three times a day (TID) | ORAL | Status: AC | PRN

## 2011-09-18 NOTE — Patient Instructions (Signed)
Take Zpack as directed.  Drink lots of fluids.  Treat sympotmatically with Mucinex, nasal saline irrigation, and Tylenol/Ibuprofen. Also try claritin D or zyrtec D over the counter- two times a day as needed ( have to sign for them at pharmacy). You can use warm compresses.  Cough suppressant at night. Call if not improving as expected in 5-7 days.    

## 2011-09-18 NOTE — Progress Notes (Signed)
SUBJECTIVE:  Brittney Miles is a 56 y.o. female who complains of coryza, congestion, sore throat, dry cough and bilateral sinus pain for 3 weeks. She denies a history of anorexia, chest pain, chills and dizziness and denies a history of asthma. Patient denies smoke cigarettes.   Patient Active Problem List  Diagnoses  . DEPRESSION, MAJOR, WITH PSYCHOTIC BEHAVIOR  . TOBACCO ABUSE  . ACUTE SINUSITIS, UNSPECIFIED  . COUGH VARIANT ASTHMA  . ARTHRITIS, RHEUMATOID  . POLYARTHRITIS  . FATIGUE  . Left breast mass   No past medical history on file. Past Surgical History  Procedure Date  . Vaginal hysterectomy 1989    partial  . Tubal ligation    History  Substance Use Topics  . Smoking status: Current Everyday Smoker -- 1.0 packs/day for 30 years    Types: Cigarettes  . Smokeless tobacco: Not on file  . Alcohol Use:    Family History  Problem Relation Age of Onset  . Diabetes Mother   . Coronary artery disease Father     s/p MI at age 59  . Cancer Sister 21    ovarian    Allergies  Allergen Reactions  . Penicillins     REACTION: Rash, weight loss.  . Sulfonamide Derivatives     REACTION: hives   Current Outpatient Prescriptions on File Prior to Visit  Medication Sig Dispense Refill  . albuterol (VENTOLIN HFA) 108 (90 BASE) MCG/ACT inhaler Inhale 2 puffs into the lungs every 4 (four) hours as needed.        . benzonatate (TESSALON) 100 MG capsule Take 100 mg by mouth 3 (three) times daily as needed.        . divalproex (DEPAKOTE) 500 MG EC tablet Take 500 mg by mouth 3 (three) times daily.       . Fluticasone-Salmeterol (ADVAIR DISKUS) 100-50 MCG/DOSE AEPB Inhale 1 puff into the lungs 2 (two) times daily.        . folic acid (FOLVITE) 1 MG tablet Take 1 mg by mouth daily.        . methotrexate 25 MG/ML SOLN Inject 0.8cc/ml under the skin once every week        The PMH, PSH, Social History, Family History, Medications, and allergies have been reviewed in [facility], and have been  updated if relevant.  OBJECTIVE: BP 132/80  Pulse 72  Temp(Src) 98.3 F (36.8 C) (Oral)  Wt 133 lb (60.328 kg)  She appears well, vital signs are as noted. Ears normal.  Throat and pharynx normal.  Neck supple. No adenopathy in the neck. Nose is congested. Sinuses non tender. The chest is clear, without wheezes or rales.  ASSESSMENT:  sinusitis  PLAN: Given duration and progression of symptoms, will treat for bacterial sinusitis. Symptomatic therapy suggested: push fluids, rest and return office visit prn if symptoms persist or worsen. Call or return to clinic prn if these symptoms worsen or fail to improve as anticipated.

## 2014-04-08 ENCOUNTER — Encounter: Payer: Self-pay | Admitting: Family Medicine

## 2014-04-08 ENCOUNTER — Ambulatory Visit (INDEPENDENT_AMBULATORY_CARE_PROVIDER_SITE_OTHER): Payer: 59 | Admitting: Family Medicine

## 2014-04-08 VITALS — BP 108/62 | HR 96 | Temp 98.1°F | Wt 122.8 lb

## 2014-04-08 DIAGNOSIS — M069 Rheumatoid arthritis, unspecified: Secondary | ICD-10-CM

## 2014-04-08 MED ORDER — TRAMADOL HCL 50 MG PO TABS: 50.0000 mg | ORAL_TABLET | Freq: Three times a day (TID) | ORAL | Status: DC | PRN

## 2014-04-08 NOTE — Progress Notes (Signed)
Pre visit review using our clinic review tool, if applicable. No additional management support is needed unless otherwise documented below in the visit note. 

## 2014-04-08 NOTE — Assessment & Plan Note (Signed)
Deteriorated and lost to follow up. Needs to see rheum ASAP- I am very concerned about permanent erosion of her joints at this point. Discussed starting prednisone but given severity, will give rx for prn tramadol and refer back to rheum urgently to start therapy. The patient indicates understanding of these issues and agrees with the plan.

## 2014-04-08 NOTE — Progress Notes (Signed)
Subjective:   Patient ID: Brittney Miles, female    DOB: May 26, 1956, 58 y.o.   MRN: [id number]  Brittney Miles is a pleasant 58 y.o. year old female who presents to clinic today with Rheumatoid Arthritis  on 04/08/2014  HPI: H/o seropositive RA. Has not been on any rx since 02/2011.  Was seeing Dr. Dareen Piano- was receiving weekly MTX injections.  Stopped going- had severe paranoia/psychosis and was followed by psychiatry.  She no longer is taking any of her anti psychotics but states she is doing "fine" mentally.  Working now as a Conservation officer, nature.  Denies feeling depressed, anxious, paranoid, manic or psychotic.  Joints have been "really bad."  Left knee is very painful and swollen.  Hands bilaterally are painful and swollen.  Not taking anything for the pain.  Current Outpatient Prescriptions on File Prior to Visit  Medication Sig Dispense Refill  . albuterol (VENTOLIN HFA) 108 (90 BASE) MCG/ACT inhaler Inhale 2 puffs into the lungs every 4 (four) hours as needed.        . [DISCONTINUED] QUEtiapine (SEROQUEL) 400 MG tablet Take 400 mg by mouth at bedtime.         No current facility-administered medications on file prior to visit.    Allergies  Allergen Reactions  . Penicillins     REACTION: Rash, weight loss.  . Sulfonamide Derivatives     REACTION: hives    No past medical history on file.  Past Surgical History  Procedure Laterality Date  . Vaginal hysterectomy  1989    partial  . Tubal ligation      Family History  Problem Relation Age of Onset  . Diabetes Mother   . Coronary artery disease Father     s/p MI at age 47  . Cancer Sister 15    ovarian     History   Social History  . Marital Status: Married    Spouse Name: N/A    Number of Children: N/A  . Years of Education: N/A   Occupational History  . Homemaker    Social History Main Topics  . Smoking status: Current Every Day Smoker -- 1.00 packs/day for 30 years    Types: Cigarettes  . Smokeless tobacco: Not on  file  . Alcohol Use:   . Drug Use:   . Sexual Activity:    Other Topics Concern  . Not on file   Social History Narrative   Moved her from [place]   The PMH, PSH, Social History, Family History, Medications, and allergies have been reviewed in [facility], and have been updated if relevant.   Review of Systems See HPI + fatigue No fevers + malaise +joint pain  No LE edema    +weight loss since I last saw her over two years ago- she reports that her appetite is affected by her pain Objective:    BP 108/62  Pulse 96  Temp(Src) 98.1 F (36.7 C) (Oral)  Wt 122 lb 12 oz (55.679 kg)  SpO2 98%  Wt Readings from Last 3 Encounters:  04/08/14 122 lb 12 oz (55.679 kg)  09/18/11 133 lb (60.328 kg)  10/27/10 143 lb 6.4 oz (65.046 kg)    Physical Exam  Nursing note and vitals reviewed. Constitutional: She appears well-developed and well-nourished. No distress.  HENT:  Head: Normocephalic.  Musculoskeletal:  Multiple swollen joints Left knee is tender, she is walking with a limp Has bilateral swan deformities of hands  Neurological: She is alert. No cranial nerve deficit.  Skin: Skin is warm and dry.  Psychiatric: She has a normal mood and affect. Her behavior is normal. Judgment and thought content normal.          Assessment & Plan:   ARTHRITIS, RHEUMATOID - Plan: Ambulatory referral to Rheumatology No Follow-up on file.

## 2014-04-08 NOTE — Patient Instructions (Signed)
Good to see you. Please stop by to see Shirlee Limerick today to set up your appointment with Dr. Dareen Piano.

## 2014-04-09 ENCOUNTER — Telehealth: Payer: Self-pay | Admitting: Family Medicine

## 2014-04-09 ENCOUNTER — Telehealth: Payer: Self-pay

## 2014-04-09 NOTE — Telephone Encounter (Signed)
Lm on pts vm requesting a call back 

## 2014-04-09 NOTE — Telephone Encounter (Signed)
Pt left v/m; pt was seen 04/08/14 and wanted Dr Dayton Martes to know pt takes Meloxicam 7.5 mg taking one daily and Folic Acid 1 mg taking one daily; pt request new rx sent to [facility]Please advise. (not on med list)

## 2014-04-09 NOTE — Telephone Encounter (Signed)
emmi mailed  °

## 2014-04-09 NOTE — Telephone Encounter (Signed)
She needs to wait to see rheum about folic acid b/c that was likely started due to the methotrexate they prescribed. Ok to send in 30 day supply ONLY of meloxicam 7.5 mg daily- 1 tab by mouth daily.

## 2014-04-10 ENCOUNTER — Telehealth: Payer: Self-pay | Admitting: Family Medicine

## 2014-04-10 MED ORDER — MELOXICAM 7.5 MG PO TABS: 7.5000 mg | ORAL_TABLET | Freq: Every day | ORAL | Status: DC

## 2014-04-10 NOTE — Telephone Encounter (Signed)
Spoke to Brittney Miles and advised per Dr Dayton Martes. Rx for Meloxicam sent to requested pharmacy. Brittney Miles advised that this is the only Rx for this med she will be able to receive. Brittney Miles verbally expressed understanding and states that she has an appt with rheumatology Nov 2015

## 2014-04-10 NOTE — Telephone Encounter (Signed)
Lm on pts vm requesting a call back 

## 2014-04-10 NOTE — Telephone Encounter (Signed)
Pt returned call from [place]. Pt is at work today and requested cb @ [phone].

## 2014-04-10 NOTE — Telephone Encounter (Signed)
See previous phone note.  

## 2014-04-24 ENCOUNTER — Other Ambulatory Visit: Payer: Self-pay | Admitting: Family Medicine

## 2014-04-24 DIAGNOSIS — Z01419 Encounter for gynecological examination (general) (routine) without abnormal findings: Secondary | ICD-10-CM

## 2014-04-29 ENCOUNTER — Other Ambulatory Visit (INDEPENDENT_AMBULATORY_CARE_PROVIDER_SITE_OTHER): Payer: 59

## 2014-04-29 DIAGNOSIS — R5381 Other malaise: Secondary | ICD-10-CM

## 2014-04-29 DIAGNOSIS — Z01419 Encounter for gynecological examination (general) (routine) without abnormal findings: Secondary | ICD-10-CM

## 2014-04-29 DIAGNOSIS — Z0000 Encounter for general adult medical examination without abnormal findings: Secondary | ICD-10-CM

## 2014-04-29 DIAGNOSIS — R5383 Other fatigue: Secondary | ICD-10-CM

## 2014-04-29 LAB — COMPREHENSIVE METABOLIC PANEL
ALBUMIN: 3.8 g/dL (ref 3.5–5.2)
ALT: 10 U/L (ref 0–35)
AST: 20 U/L (ref 0–37)
Alkaline Phosphatase: 106 U/L (ref 39–117)
BUN: 8 mg/dL (ref 6–23)
CALCIUM: 9.3 mg/dL (ref 8.4–10.5)
CO2: 25 meq/L (ref 19–32)
Chloride: 102 mEq/L (ref 96–112)
Creatinine, Ser: 0.8 mg/dL (ref 0.4–1.2)
GFR: 94.64 mL/min (ref >60.00)
GLUCOSE: 85 mg/dL (ref 70–99)
POTASSIUM: 4.8 meq/L (ref 3.5–5.1)
SODIUM: 138 meq/L (ref 135–145)
TOTAL PROTEIN: 8.3 g/dL (ref 6.0–8.3)
Total Bilirubin: 0.6 mg/dL (ref 0.2–1.2)

## 2014-04-29 LAB — CBC WITH DIFFERENTIAL/PLATELET
BASOS PCT: 0.1 % (ref 0.0–3.0)
Basophils Absolute: 0 10*3/uL (ref 0.0–0.1)
EOS PCT: 0.6 % (ref 0.0–5.0)
Eosinophils Absolute: 0.1 10*3/uL (ref 0.0–0.7)
HEMATOCRIT: 43.1 % (ref 36.0–46.0)
Hemoglobin: 14.6 g/dL (ref 12.0–15.0)
Lymphocytes Relative: 6.2 % — ABNORMAL LOW (ref 12.0–46.0)
Lymphs Abs: 0.7 10*3/uL (ref 0.7–4.0)
MCHC: 33.8 g/dL (ref 30.0–36.0)
MCV: 85.4 fl (ref 78.0–100.0)
MONOS PCT: 11.4 % (ref 3.0–12.0)
Monocytes Absolute: 1.3 10*3/uL — ABNORMAL HIGH (ref 0.1–1.0)
NEUTROS PCT: 81.7 % — AB (ref 43.0–77.0)
Neutro Abs: 9 10*3/uL — ABNORMAL HIGH (ref 1.4–7.7)
PLATELETS: 364 10*3/uL (ref 150.0–400.0)
RBC: 5.05 Mil/uL (ref 3.87–5.11)
RDW: 13 % (ref 11.5–15.5)
WBC: 11.1 10*3/uL — AB (ref 4.0–10.5)

## 2014-04-29 LAB — LIPID PANEL
CHOLESTEROL: 171 mg/dL (ref 0–200)
HDL: 49.9 mg/dL (ref >39.00)
LDL CALC: 103 mg/dL — AB (ref 0–99)
NonHDL: 121.1
Total CHOL/HDL Ratio: 3
Triglycerides: 93 mg/dL (ref 0.0–149.0)
VLDL: 18.6 mg/dL (ref 0.0–40.0)

## 2014-04-29 LAB — TSH: TSH: 0.74 u[IU]/mL (ref 0.35–4.50)

## 2014-05-06 ENCOUNTER — Encounter: Payer: Self-pay | Admitting: Family Medicine

## 2014-05-06 ENCOUNTER — Other Ambulatory Visit (HOSPITAL_COMMUNITY)
Admission: RE | Admit: 2014-05-06 | Discharge: 2014-05-06 | Disposition: A | Discharge status: Home / Self Care | Payer: 59 | Source: Ambulatory Visit | Attending: Family Medicine | Admitting: Family Medicine

## 2014-05-06 ENCOUNTER — Ambulatory Visit (INDEPENDENT_AMBULATORY_CARE_PROVIDER_SITE_OTHER): Payer: 59 | Admitting: Family Medicine

## 2014-05-06 VITALS — BP 118/72 | HR 96 | Temp 97.8°F | Ht 63.25 in | Wt 120.5 lb

## 2014-05-06 DIAGNOSIS — F32A Depression, unspecified: Secondary | ICD-10-CM

## 2014-05-06 DIAGNOSIS — Z01419 Encounter for gynecological examination (general) (routine) without abnormal findings: Secondary | ICD-10-CM | POA: Diagnosis not present

## 2014-05-06 DIAGNOSIS — F323 Major depressive disorder, single episode, severe with psychotic features: Secondary | ICD-10-CM

## 2014-05-06 DIAGNOSIS — Z1151 Encounter for screening for human papillomavirus (HPV): Secondary | ICD-10-CM | POA: Insufficient documentation

## 2014-05-06 DIAGNOSIS — Z720 Tobacco use: Secondary | ICD-10-CM

## 2014-05-06 DIAGNOSIS — Z1239 Encounter for other screening for malignant neoplasm of breast: Secondary | ICD-10-CM

## 2014-05-06 DIAGNOSIS — F17200 Nicotine dependence, unspecified, uncomplicated: Secondary | ICD-10-CM

## 2014-05-06 DIAGNOSIS — R7989 Other specified abnormal findings of blood chemistry: Secondary | ICD-10-CM | POA: Insufficient documentation

## 2014-05-06 DIAGNOSIS — M069 Rheumatoid arthritis, unspecified: Secondary | ICD-10-CM

## 2014-05-06 NOTE — Assessment & Plan Note (Signed)
New- I suspect this is due to smoking and chronic poorly controlled RA. Will repeat in 2 months. The patient indicates understanding of these issues and agrees with the plan.

## 2014-05-06 NOTE — Progress Notes (Signed)
Pre visit review using our clinic review tool, if applicable. No additional management support is needed unless otherwise documented below in the visit note. 

## 2014-05-06 NOTE — Assessment & Plan Note (Signed)
Keep appt with rheum. Continue prn tramadol for now. The patient indicates understanding of these issues and agrees with the plan.

## 2014-05-06 NOTE — Addendum Note (Signed)
Addended by: Desmond Dike on: 05/06/2014 12:23 PM   Modules accepted: Orders

## 2014-05-06 NOTE — Assessment & Plan Note (Signed)
Resolved.  She denies any symptoms of depression or anxiety.

## 2014-05-06 NOTE — Patient Instructions (Signed)
Great to see you. Please return in 2 months to repeat your blood work. I hope your rheumatology visit goes well.  Please call to set up your mammogram.

## 2014-05-06 NOTE — Assessment & Plan Note (Signed)
Reviewed preventive care protocols, scheduled due services, and updated immunizations Discussed nutrition, exercise, diet, and healthy lifestyle.  Pap smear done today. Declined influenza vaccine. Mammogram ordered.

## 2014-05-06 NOTE — Progress Notes (Signed)
Subjective:    Patient ID: Brittney Miles, female    DOB: 1956-03-18, 58 y.o.   MRN: [id number]  HPI  58 yo here for CPX. Has been lost to routine follow up.  I saw her last month after she had not been here for over 2 1/2 years for worsening RA.  HPI from that visit as below: H/o seropositive RA.  Has not been on any rx since 02/2011. Was seeing Dr. Dareen Miles- was receiving weekly MTX injections.  Stopped going- had severe paranoia/psychosis and was followed by psychiatry. She no longer is taking any of her anti psychotics but states she is doing "fine" mentally. Working now as a Conservation officer, nature. Denies feeling depressed, anxious, paranoid, manic or psychotic.  Joints have been "really bad." Left knee is very painful and swollen. Hands bilaterally are painful and swollen. Not taking anything for the pain  Advised seeing rheum ASAP- rx for tramadol given and referred back to rheumatology. Has appt with rheum next week.  She already feel pain and range of motion of her digits improved with tramadol.  Mammogram 10/27/10 Pap smear 11/03/10- no h/o post menopausal bleeding. No h/o abnormal pap smears. Colonoscopy 10/04/09- Dr. Christella Miles- 10 year recall  Lab Results  Component Value Date   CHOL 171 04/29/2014   HDL 49.90 04/29/2014   LDLCALC 103* 04/29/2014   TRIG 93.0 04/29/2014   CHOLHDL 3 04/29/2014   CBC a little abnormal this month-  Lab Results  Component Value Date   WBC 11.1* 04/29/2014   HGB 14.6 04/29/2014   HCT 43.1 04/29/2014   MCV 85.4 04/29/2014   PLT 364.0 04/29/2014   Lab Results  Component Value Date   CREATININE 0.8 04/29/2014   Lab Results  Component Value Date   TSH 0.74 04/29/2014   Current Outpatient Prescriptions on File Prior to Visit  Medication Sig Dispense Refill  . albuterol (VENTOLIN HFA) 108 (90 BASE) MCG/ACT inhaler Inhale 2 puffs into the lungs every 4 (four) hours as needed.        . meloxicam (MOBIC) 7.5 MG tablet Take 1 tablet (7.5 mg total) by mouth  daily.  30 tablet  0  . traMADol (ULTRAM) 50 MG tablet Take 1 tablet (50 mg total) by mouth every 8 (eight) hours as needed.  30 tablet  0  . [DISCONTINUED] QUEtiapine (SEROQUEL) 400 MG tablet Take 400 mg by mouth at bedtime.         No current facility-administered medications on file prior to visit.    Allergies  Allergen Reactions  . Penicillins     REACTION: Rash, weight loss.  . Sulfonamide Derivatives     REACTION: hives    No past medical history on file.  Past Surgical History  Procedure Laterality Date  . Vaginal hysterectomy  1989    partial  . Tubal ligation      Family History  Problem Relation Age of Onset  . Diabetes Mother   . Coronary artery disease Father     s/p MI at age 98  . Cancer Sister 53    ovarian     History   Social History  . Marital Status: Married    Spouse Name: N/A    Number of Children: N/A  . Years of Education: N/A   Occupational History  . Homemaker    Social History Main Topics  . Smoking status: Current Every Day Smoker -- 1.00 packs/day for 30 years    Types: Cigarettes  . Smokeless tobacco:  Not on file  . Alcohol Use:   . Drug Use:   . Sexual Activity:    Other Topics Concern  . Not on file   Social History Narrative   Moved her from [place]   The PMH, PSH, Social History, Family History, Medications, and allergies have been reviewed in [facility], and have been updated if relevant.   Review of Systems  Constitutional: Negative for fever, chills, activity change, appetite change and fatigue.  HENT: Negative.   Eyes: Negative.   Respiratory: Negative.   Cardiovascular: Negative.   Gastrointestinal: Negative.   Endocrine: Negative.   Genitourinary: Negative.  Negative for vaginal bleeding.  Musculoskeletal: Positive for arthralgias and joint swelling.  Allergic/Immunologic: Negative.   Neurological: Negative.   Hematological: Negative.   Psychiatric/Behavioral: Negative.    See HPI    Objective:   Physical  Exam BP 118/72  Pulse 96  Temp(Src) 97.8 F (36.6 C) (Oral)  Ht 5' 3.25" (1.607 m)  Wt 120 lb 8 oz (54.658 kg)  BMI 21.17 kg/m2  SpO2 95%   General:  Well-developed,well-nourished,in no acute distress; alert,appropriate and cooperative throughout examination Head:  normocephalic and atraumatic.   Eyes:  vision grossly intact, pupils equal, pupils round, and pupils reactive to light.   Ears:  R ear normal and L ear normal.   Nose:  no external deformity.   Mouth:  Poor dentition Neck:  No deformities, masses, or tenderness noted. Breasts:  No mass, nodules, thickening, tenderness, bulging, retraction, inflamation, nipple discharge or skin changes noted.   Lungs:  Normal respiratory effort, chest expands symmetrically. Lungs are clear to auscultation, no crackles or wheezes. Heart:  Normal rate and regular rhythm. S1 and S2 normal without gallop, murmur, click, rub or other extra sounds. Abdomen:  Bowel sounds positive,abdomen soft and non-tender without masses, organomegaly or hernias noted. Rectal:  no external abnormalities.   Genitalia:  Pelvic Exam:        External: normal female genitalia without lesions or masses        Vagina: normal without lesions or masses        Cervix: normal without lesions or masses        Adnexa: normal bimanual exam without masses or fullness        Uterus: normal by palpation        Pap smear: performed Msk: bilateral hand deformity Extremities:  No clubbing, cyanosis, edema, or deformity noted with normal full range of motion of all joints.   Neurologic:  alert & oriented X3 and gait normal.   Skin:  Intact without suspicious lesions or rashes Cervical Nodes:  No lymphadenopathy noted Axillary Nodes:  No palpable lymphadenopathy Psych:  Cognition and judgment appear intact. Alert and cooperative with normal attention span and concentration. No apparent delusions, illusions, hallucinations     Assessment & Plan:

## 2014-05-07 LAB — CYTOLOGY - PAP

## 2014-05-08 ENCOUNTER — Encounter: Payer: Self-pay | Admitting: *Deleted

## 2015-02-01 ENCOUNTER — Telehealth: Payer: Self-pay

## 2015-02-01 NOTE — Telephone Encounter (Signed)
Left a voicemail for patient to return my call, in regards to scheduling a Mammogram.  

## 2016-08-22 ENCOUNTER — Ambulatory Visit (INDEPENDENT_AMBULATORY_CARE_PROVIDER_SITE_OTHER): Payer: 59 | Admitting: Family Medicine

## 2016-08-22 ENCOUNTER — Encounter: Payer: Self-pay | Admitting: Family Medicine

## 2016-08-22 ENCOUNTER — Telehealth: Payer: Self-pay

## 2016-08-22 ENCOUNTER — Ambulatory Visit (INDEPENDENT_AMBULATORY_CARE_PROVIDER_SITE_OTHER)
Admission: RE | Admit: 2016-08-22 | Discharge: 2016-08-22 | Disposition: A | Discharge status: Home / Self Care | Payer: 59 | Source: Ambulatory Visit | Attending: Family Medicine | Admitting: Family Medicine

## 2016-08-22 ENCOUNTER — Other Ambulatory Visit: Payer: Self-pay | Admitting: Family Medicine

## 2016-08-22 VITALS — BP 126/70 | HR 135 | Temp 98.3°F | Wt 104.2 lb

## 2016-08-22 DIAGNOSIS — R9389 Abnormal findings on diagnostic imaging of other specified body structures: Secondary | ICD-10-CM

## 2016-08-22 DIAGNOSIS — IMO0001 Reserved for inherently not codable concepts without codable children: Secondary | ICD-10-CM | POA: Insufficient documentation

## 2016-08-22 DIAGNOSIS — R634 Abnormal weight loss: Secondary | ICD-10-CM

## 2016-08-22 DIAGNOSIS — F32A Depression, unspecified: Secondary | ICD-10-CM

## 2016-08-22 DIAGNOSIS — R05 Cough: Secondary | ICD-10-CM

## 2016-08-22 DIAGNOSIS — R059 Cough, unspecified: Secondary | ICD-10-CM

## 2016-08-22 DIAGNOSIS — F323 Major depressive disorder, single episode, severe with psychotic features: Secondary | ICD-10-CM

## 2016-08-22 DIAGNOSIS — M05711 Rheumatoid arthritis with rheumatoid factor of right shoulder without organ or systems involvement: Secondary | ICD-10-CM | POA: Diagnosis not present

## 2016-08-22 DIAGNOSIS — F17200 Nicotine dependence, unspecified, uncomplicated: Secondary | ICD-10-CM

## 2016-08-22 LAB — CBC WITH DIFFERENTIAL/PLATELET
Basophils Absolute: 0 10*3/uL (ref 0.0–0.1)
Basophils Relative: 0.4 % (ref 0.0–3.0)
EOS PCT: 0.2 % (ref 0.0–5.0)
Eosinophils Absolute: 0 10*3/uL (ref 0.0–0.7)
HEMATOCRIT: 42.5 % (ref 36.0–46.0)
Hemoglobin: 15 g/dL (ref 12.0–15.0)
LYMPHS ABS: 1.7 10*3/uL (ref 0.7–4.0)
Lymphocytes Relative: 16.5 % (ref 12.0–46.0)
MCHC: 35.3 g/dL (ref 30.0–36.0)
MCV: 82.3 fl (ref 78.0–100.0)
MONOS PCT: 8.4 % (ref 3.0–12.0)
Monocytes Absolute: 0.9 10*3/uL (ref 0.1–1.0)
Neutro Abs: 7.5 10*3/uL (ref 1.4–7.7)
Neutrophils Relative %: 74.5 % (ref 43.0–77.0)
PLATELETS: 442 10*3/uL — AB (ref 150.0–400.0)
RBC: 5.17 Mil/uL — ABNORMAL HIGH (ref 3.87–5.11)
RDW: 12.7 % (ref 11.5–15.5)
WBC: 10.1 10*3/uL (ref 4.0–10.5)

## 2016-08-22 LAB — COMPREHENSIVE METABOLIC PANEL
ALT: 12 U/L (ref 0–35)
AST: 18 U/L (ref 0–37)
Albumin: 4.3 g/dL (ref 3.5–5.2)
Alkaline Phosphatase: 128 U/L — ABNORMAL HIGH (ref 39–117)
BUN: 11 mg/dL (ref 6–23)
CALCIUM: 9.5 mg/dL (ref 8.4–10.5)
CO2: 27 mEq/L (ref 19–32)
Chloride: 97 mEq/L (ref 96–112)
Creatinine, Ser: 0.59 mg/dL (ref 0.40–1.20)
GFR: 133.43 mL/min (ref >60.00)
Glucose, Bld: 104 mg/dL — ABNORMAL HIGH (ref 70–99)
Potassium: 4.7 mEq/L (ref 3.5–5.1)
Sodium: 132 mEq/L — ABNORMAL LOW (ref 135–145)
Total Bilirubin: 0.6 mg/dL (ref 0.2–1.2)
Total Protein: 8.2 g/dL (ref 6.0–8.3)

## 2016-08-22 LAB — TSH: TSH: 1.14 u[IU]/mL (ref 0.35–4.50)

## 2016-08-22 NOTE — Assessment & Plan Note (Signed)
Deteriorated. Denies SI or HI but does seem more depressed and paranoid. Refer back to psychiatry. The patient indicates understanding of these issues and agrees with the plan.

## 2016-08-22 NOTE — Progress Notes (Signed)
Subjective:   Patient ID: Brittney Miles, female    DOB: February 25, 1956, 61 y.o.   MRN: [id number]  Brittney Miles is a pleasant 61 y.o. year old female who presents to clinic today with Follow-up; Cough; and Weight Loss  on 08/22/2016  HPI:  I have not seen pt since 05/06/2014.  Note reviewed. At that time, had lost touch with her rheumatologist and had not been taking her RA medications. She had an episode of severe paranoia/psychosis and was followed by psychiatry which she is no longer doing.  Feels her depression and paranoia is worsening.  Denies SI or HI.  Appt scheduled with rheumatology for her the following week.  She has not been taking RA meds  Weight loss- has been unintentional.  Decreased appetite.  Has had a productive cough.  She is smoker.  No hemoptysis.  Wt Readings from Last 3 Encounters:  08/22/16 104 lb 4 oz (47.3 kg)  05/06/14 120 lb 8 oz (54.7 kg)  04/08/14 122 lb 12 oz (55.7 kg)   Lats colonoscopy 10/04/09- Dr. Christella Hartigan, reviewed today.  Advised 10 year recall.   Current Outpatient Prescriptions on File Prior to Visit  Medication Sig Dispense Refill  . albuterol (VENTOLIN HFA) 108 (90 BASE) MCG/ACT inhaler Inhale 2 puffs into the lungs every 4 (four) hours as needed.      . meloxicam (MOBIC) 7.5 MG tablet Take 1 tablet (7.5 mg total) by mouth daily. (Patient not taking: Reported on 08/22/2016) 30 tablet 0  . traMADol (ULTRAM) 50 MG tablet Take 1 tablet (50 mg total) by mouth every 8 (eight) hours as needed. (Patient not taking: Reported on 08/22/2016) 30 tablet 0  . [DISCONTINUED] QUEtiapine (SEROQUEL) 400 MG tablet Take 400 mg by mouth at bedtime.       No current facility-administered medications on file prior to visit.     Allergies  Allergen Reactions  . Penicillins     REACTION: Rash, weight loss.  . Sulfonamide Derivatives     REACTION: hives    No past medical history on file.  Past Surgical History:  Procedure Laterality Date  . TUBAL LIGATION     . VAGINAL HYSTERECTOMY  1989   partial    Family History  Problem Relation Age of Onset  . Diabetes Mother   . Coronary artery disease Father     s/p MI at age 13  . Cancer Sister 22    ovarian     Social History   Social History  . Marital status: Married    Spouse name: N/A  . Number of children: N/A  . Years of education: N/A   Occupational History  . Homemaker Unemployed   Social History Main Topics  . Smoking status: Current Every Day Smoker    Packs/day: 1.00    Years: 30.00    Types: Cigarettes  . Smokeless tobacco: Never Used  . Alcohol use Not on file  . Drug use: Unknown  . Sexual activity: Not on file   Other Topics Concern  . Not on file   Social History Narrative   Moved her from [place]   The PMH, PSH, Social History, Family History, Medications, and allergies have been reviewed in [facility], and have been updated if relevant.   Review of Systems  Constitutional: Positive for fatigue and unexpected weight change.  HENT: Negative.   Eyes: Negative.   Respiratory: Positive for cough. Negative for shortness of breath and stridor.   Cardiovascular: Negative.   Gastrointestinal:  Negative.   Endocrine: Negative.   Genitourinary: Negative.   Musculoskeletal: Positive for arthralgias.  Neurological: Negative.   Hematological: Negative.   Psychiatric/Behavioral: Positive for dysphoric mood. Negative for behavioral problems, confusion and decreased concentration.  All other systems reviewed and are negative.      Objective:    BP 126/70   Pulse (!) 135   Temp 98.3 F (36.8 C) (Oral)   Wt 104 lb 4 oz (47.3 kg)   SpO2 97%   BMI 18.32 kg/m    Physical Exam  Constitutional: She is oriented to person, place, and time.  Appears thin, NAD  HENT:  Head: Normocephalic.  Eyes: Conjunctivae are normal.  Cardiovascular: Regular rhythm.   Pulmonary/Chest: Effort normal and breath sounds normal. No respiratory distress. She has no wheezes. She has no  rales. She exhibits no tenderness.  Neurological: She is alert and oriented to person, place, and time. No cranial nerve deficit.  Skin: Skin is warm and dry.  Psychiatric:  tearful  Nursing note and vitals reviewed.         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

## 2016-08-22 NOTE — Assessment & Plan Note (Signed)
Deteriorated. Again, stopped taking her MTX. Agrees to go back to rheum.  Another referral placed.

## 2016-08-22 NOTE — Assessment & Plan Note (Addendum)
Likely multifactorial- depression, pain, depression all playing a roll. Also concerned about cough.  CXR today given history of smoking. Will also check labs today.

## 2016-08-22 NOTE — Patient Instructions (Signed)
Great to see you.  I will call you with your xray and labs results tomorrow.  On your way out, please stop by to see [facility].

## 2016-08-22 NOTE — Telephone Encounter (Signed)
Results discussed with pt

## 2016-08-22 NOTE — Assessment & Plan Note (Signed)
CXR today.  

## 2016-08-22 NOTE — Progress Notes (Signed)
Pre visit review using our clinic review tool, if applicable. No additional management support is needed unless otherwise documented below in the visit note. 

## 2016-08-22 NOTE — Telephone Encounter (Signed)
Brittney Miles at [facility] radiology called report on CXR; report is in Epic; Dr Dayton Martes is not in office this afternoon. Dr Para March looked at report and said OK to send to Dr Dayton Martes in phone note.

## 2016-08-23 LAB — RHEUMATOID FACTOR: Rhuematoid fact SerPl-aCnc: 330 IU/mL — ABNORMAL HIGH (ref <14)

## 2016-08-24 ENCOUNTER — Ambulatory Visit (INDEPENDENT_AMBULATORY_CARE_PROVIDER_SITE_OTHER)
Admission: RE | Admit: 2016-08-24 | Discharge: 2016-08-24 | Disposition: A | Discharge status: Home / Self Care | Payer: 59 | Source: Ambulatory Visit | Attending: Family Medicine | Admitting: Family Medicine

## 2016-08-24 DIAGNOSIS — F17200 Nicotine dependence, unspecified, uncomplicated: Secondary | ICD-10-CM

## 2016-08-24 DIAGNOSIS — R9389 Abnormal findings on diagnostic imaging of other specified body structures: Secondary | ICD-10-CM

## 2016-08-24 DIAGNOSIS — R938 Abnormal findings on diagnostic imaging of other specified body structures: Secondary | ICD-10-CM | POA: Diagnosis not present

## 2016-08-24 DIAGNOSIS — R634 Abnormal weight loss: Secondary | ICD-10-CM

## 2016-08-24 MED ORDER — IOPAMIDOL (ISOVUE-300) INJECTION 61%
70.0000 mL | Freq: Once | INTRAVENOUS | Status: AC | PRN
  Administered 2016-08-24: 70 mL via INTRAVENOUS

## 2016-08-26 ENCOUNTER — Emergency Department (HOSPITAL_COMMUNITY)
Admission: EM | Admit: 2016-08-26 | Discharge: 2016-08-26 | Disposition: A | Discharge status: Home / Self Care | Payer: 59 | Attending: Emergency Medicine | Admitting: Emergency Medicine

## 2016-08-26 ENCOUNTER — Encounter: Payer: Self-pay | Admitting: Emergency Medicine

## 2016-08-26 ENCOUNTER — Emergency Department (HOSPITAL_COMMUNITY): Payer: 59

## 2016-08-26 ENCOUNTER — Encounter (HOSPITAL_COMMUNITY): Payer: Self-pay | Admitting: Emergency Medicine

## 2016-08-26 DIAGNOSIS — J069 Acute upper respiratory infection, unspecified: Secondary | ICD-10-CM | POA: Insufficient documentation

## 2016-08-26 DIAGNOSIS — Z79899 Other long term (current) drug therapy: Secondary | ICD-10-CM | POA: Insufficient documentation

## 2016-08-26 DIAGNOSIS — Z7982 Long term (current) use of aspirin: Secondary | ICD-10-CM | POA: Insufficient documentation

## 2016-08-26 DIAGNOSIS — F17210 Nicotine dependence, cigarettes, uncomplicated: Secondary | ICD-10-CM | POA: Diagnosis not present

## 2016-08-26 DIAGNOSIS — R05 Cough: Secondary | ICD-10-CM | POA: Diagnosis present

## 2016-08-26 HISTORY — DX: Rheumatoid arthritis, unspecified: M06.9

## 2016-08-26 LAB — BASIC METABOLIC PANEL
Anion gap: 15 (ref 5–15)
CHLORIDE: 94 mmol/L — AB (ref 101–111)
CO2: 22 mmol/L (ref 22–32)
CREATININE: 0.64 mg/dL (ref 0.44–1.00)
Calcium: 9.3 mg/dL (ref 8.9–10.3)
GFR calc Af Amer: >60 mL/min (ref >60)
GFR calc non Af Amer: >60 mL/min (ref >60)
GLUCOSE: 102 mg/dL — AB (ref 65–99)
POTASSIUM: 4 mmol/L (ref 3.5–5.1)
SODIUM: 131 mmol/L — AB (ref 135–145)

## 2016-08-26 LAB — CBC
HEMATOCRIT: 39.7 % (ref 36.0–46.0)
HEMOGLOBIN: 14.5 g/dL (ref 12.0–15.0)
MCH: 28.8 pg (ref 26.0–34.0)
MCHC: 36.5 g/dL — AB (ref 30.0–36.0)
MCV: 78.8 fL (ref 78.0–100.0)
Platelets: 387 10*3/uL (ref 150–400)
RBC: 5.04 MIL/uL (ref 3.87–5.11)
RDW: 12.9 % (ref 11.5–15.5)
WBC: 9.1 10*3/uL (ref 4.0–10.5)

## 2016-08-26 MED ORDER — IPRATROPIUM-ALBUTEROL 0.5-2.5 (3) MG/3ML IN SOLN
3.0000 mL | Freq: Once | RESPIRATORY_TRACT | Status: AC
  Administered 2016-08-26: 3 mL via RESPIRATORY_TRACT
  Filled 2016-08-26: qty 3

## 2016-08-26 MED ORDER — FLUTICASONE PROPIONATE 50 MCG/ACT NA SUSP: 2.0000 | Freq: Every day | NASAL | 0 refills | Status: AC

## 2016-08-26 MED ORDER — ALBUTEROL SULFATE HFA 108 (90 BASE) MCG/ACT IN AERS: 1.0000 | INHALATION_SPRAY | Freq: Four times a day (QID) | RESPIRATORY_TRACT | 0 refills | Status: AC | PRN

## 2016-08-26 MED ORDER — PREDNISONE 10 MG (21) PO TBPK: 10.0000 mg | ORAL_TABLET | Freq: Every day | ORAL | 0 refills | Status: AC

## 2016-08-26 MED ORDER — DM-GUAIFENESIN ER 30-600 MG PO TB12: 1.0000 | ORAL_TABLET | Freq: Two times a day (BID) | ORAL | 0 refills | Status: AC | PRN

## 2016-08-26 MED ORDER — BENZONATATE 100 MG PO CAPS: 100.0000 mg | ORAL_CAPSULE | Freq: Three times a day (TID) | ORAL | 0 refills | Status: AC | PRN

## 2016-08-26 NOTE — ED Provider Notes (Signed)
[facility]ER[facility]Y DEPT Provider Note   CSN: [id number] Arrival date & time: 08/26/16  4332     History   Chief Complaint Chief Complaint  Patient presents with  . generalized weakness  . Cough    HPI Brittney Miles is a 61 y.o. female with PMHx of cough, smoking, Ra presents today for generalized body aches x 7 days. She reports decreased appetite, chills, productive cough with yellow and clear sputum, shortness of breath, diarrhea, unintentional weight loss. She states her symptoms are worse in the morning. She reports her symptoms are constant, achy, 8/10. She has tried bayer aspirin with no relief. She reports seeing her PCP on Tuesday who ordered a CT of chest for her and called her to tell her the results. She states she learned she has some "spots" in her lungs that need to be followed. She denies fever, changes in urinary symptoms, hemoptysis. She denies any sick contacts. She denies recent travel.    The history is provided by the patient. No language interpreter was used.  Cough  Associated symptoms include chills, myalgias and shortness of breath. Pertinent negatives include no chest pain.    Past Medical History:  Diagnosis Date  . Rheumatoid arthritis [facility])     Patient Active Problem List   Diagnosis Date Noted  . Weight loss, unintentional 08/22/2016  . Cough 08/22/2016  . Smoking 08/22/2016  . Abnormal CBC 05/06/2014  . DEPRESSION, MAJOR, WITH PSYCHOTIC BEHAVIOR 06/13/2010  . Rheumatoid arthritis (HCC) 12/13/2009  . TOBACCO ABUSE 07/29/2009    Past Surgical History:  Procedure Laterality Date  . TUBAL LIGATION    . VAGINAL HYSTERECTOMY  1989   partial    OB History    No data available       Home Medications    Prior to Admission medications   Medication Sig Start Date End Date Taking? Authorizing Provider  aspirin 325 MG tablet Take 325 mg by mouth every 4 (four) hours as needed for mild pain.   Yes Historical Provider, MD  albuterol (PROVENTIL  HFA;VENTOLIN HFA) 108 (90 Base) MCG/ACT inhaler Inhale 1-2 puffs into the lungs every 6 (six) hours as needed for wheezing or shortness of breath. 08/26/16   Jacqlyn Marolf Manuel [place], [place]  benzonatate (TESSALON) 100 MG capsule Take 1 capsule (100 mg total) by mouth 3 (three) times daily as needed for cough. 08/26/16   Eain Mullendore Manuel [place], [place]  dextromethorphan-guaiFENesin (MUCINEX DM) 30-600 MG 12hr tablet Take 1 tablet by mouth 2 (two) times daily as needed for cough. 08/26/16   Timea Breed Manuel [place], [place]  fluticasone (FLONASE) 50 MCG/ACT nasal spray Place 2 sprays into both nostrils daily. 08/26/16   Jarrah Babich Manuel [place], [place]  predniSONE (STERAPRED UNI-PAK 21 TAB) 10 MG (21) TBPK tablet Take 1 tablet (10 mg total) by mouth daily. Take 6 tabs day 1, 5 tabs day 2, 4 tabs day 3, 3 tabs day 4, 2 tabs day 5, and 1 on day 6 08/26/16   Lucita Lora [place], [place]    Family History Family History  Problem Relation Age of Onset  . Diabetes Mother   . Coronary artery disease Father     s/p MI at age 40  . Cancer Sister 72    ovarian     Social History Social History  Substance Use Topics  . Smoking status: Current Every Day Smoker    Packs/day: 1.00    Years: 30.00    Types: Cigarettes  . Smokeless tobacco: Never Used  .  Alcohol use Yes     Comment: 3-8 beers per day     Allergies   Penicillins and Sulfonamide derivatives   Review of Systems Review of Systems  Constitutional: Positive for appetite change and chills. Negative for fever.  Respiratory: Positive for cough and shortness of breath.   Cardiovascular: Negative for chest pain.  Gastrointestinal: Positive for diarrhea. Negative for nausea and vomiting.  Genitourinary: Negative for difficulty urinating and dysuria.  Musculoskeletal: Positive for myalgias.     Physical Exam Updated Vital Signs BP 118/84   Pulse 76   Temp 98.6 F (37 C) (Oral)   SpO2 97%   Physical Exam  Constitutional: She is oriented to person,  place, and time. She appears well-developed and well-nourished.  Well appearing. Coughing during exam   HENT:  Head: Normocephalic and atraumatic.  Right Ear: External ear normal.  Left Ear: External ear normal.  Nose: Nose normal.  Mouth/Throat: Oropharynx is clear and moist. No oropharyngeal exudate.  Oropharynx without evidence of redness or exudates. Tonsils without evidence of redness, swelling, or exudates. TM's appear normal with no evidence of bulging. EAC appear non erythematous and not swollen  Eyes: EOM are normal. Pupils are equal, round, and reactive to light.  Neck: Normal range of motion.  Normal ROM. No nuchal rigidity.   Cardiovascular: Normal rate and normal heart sounds.   Pulmonary/Chest: Effort normal and breath sounds normal. No respiratory distress. She has no wheezes. She has no rales.  Lungs CTA. No wheezing. No rales. No stridor. Normal work of breathing  Abdominal: Soft. There is no tenderness. There is no rebound and no guarding.  Soft and nontender. No rebound. No guarding. Negative murphy's sign. No focal tenderness at McBurney's point. No CVA tenderness. No evidence of hernia  Neurological: She is alert and oriented to person, place, and time.  Skin: Skin is warm.  Psychiatric: She has a normal mood and affect. Her behavior is normal.  Nursing note and vitals reviewed.    ED Treatments / Results  Labs (all labs ordered are listed, but only abnormal results are displayed) Labs Reviewed  CBC - Abnormal; Notable for the following:       Result Value   MCHC 36.5 (*)    All other components within normal limits  BASIC METABOLIC PANEL - Abnormal; Notable for the following:    Sodium 131 (*)    Chloride 94 (*)    Glucose, Bld 102 (*)    BUN <5 (*)    All other components within normal limits    EKG  EKG Interpretation None       Radiology Dg Chest 2 View  Result Date: 08/26/2016 CLINICAL DATA:  Subacute onset of generalized weakness, cough and  body aches. High blood pressure. Initial encounter. EXAM: CHEST  2 VIEW COMPARISON:  Chest radiograph performed 08/22/2016, and CT of the chest performed 08/24/2016 FINDINGS: The lungs are hyperexpanded, with flattening of the hemidiaphragms, compatible with COPD. Bulla are noted at the right lung apex. Known right-sided pulmonary nodules are not well characterized on radiograph. Left apical scarring is seen. Peribronchial thickening is noted. There is no evidence of pleural effusion or pneumothorax. Likely bilateral nipple shadows are noted. The heart is normal in size; the mediastinal contour is within normal limits. No acute osseous abnormalities are seen. IMPRESSION: Peribronchial thickening noted. Findings of COPD. Known right-sided pulmonary nodules are not well characterized on radiograph. Electronically Signed   By: Roanna Raider M.D.   On: 08/26/2016 20:08  Procedures Procedures (including critical care time)  Medications Ordered in ED Medications  ipratropium-albuterol (DUONEB) 0.5-2.5 (3) MG/3ML nebulizer solution 3 mL (3 mLs Nebulization Given 08/26/16 2121)     Initial Impression / Assessment and Plan / ED Course  I have reviewed the triage vital signs and the nursing notes.  Pertinent labs & imaging results that were available during my care of the patient were reviewed by me and considered in my medical decision making (see chart for details).    Patient with symptoms consistent with upper respiratory infection.  Vitals are stable, afebrile. Tolerating PO's.  Lungs are clear. Chest x-ray Showed peribronchial thickening. Findings of COPD. Known right-sided pulmonary nodules not well characterized on radiograph. No evidence of pneumonia, pleural effusion, or pneumothorax. Labs show mild decrease in sodium and chloride with otherwise normal findings.  Patient will be discharged with instructions to orally hydrate, rest, and use over-the-counter medications such as anti-inflammatories  ibuprofen and Aleve for muscle aches and Tylenol for fever.  Patient will also be given a cough suppressant, flonase, steroids, and albuterol for symptoms. Patient given strict follow-up with her primary care provider on Monday regarding today's visit. Reasons to immediately return to the emergency department discussed.   Final Clinical Impressions(s) / ED Diagnoses   Final diagnoses:  Upper respiratory tract infection, unspecified type    New Prescriptions Discharge Medication List as of 08/26/2016 10:42 PM    START taking these medications   Details  benzonatate (TESSALON) 100 MG capsule Take 1 capsule (100 mg total) by mouth 3 (three) times daily as needed for cough., Starting Sat 08/26/2016, Print    dextromethorphan-guaiFENesin (MUCINEX DM) 30-600 MG 12hr tablet Take 1 tablet by mouth 2 (two) times daily as needed for cough., Starting Sat 08/26/2016, Print    fluticasone (FLONASE) 50 MCG/ACT nasal spray Place 2 sprays into both nostrils daily., Starting Sat 08/26/2016, Print    predniSONE (STERAPRED UNI-PAK 21 TAB) 10 MG (21) TBPK tablet Take 1 tablet (10 mg total) by mouth daily. Take 6 tabs day 1, 5 tabs day 2, 4 tabs day 3, 3 tabs day 4, 2 tabs day 5, and 1 on day 6, Starting Sat 08/26/2016, Print         [place], [place] 08/27/16 0002    Lavera Guise, MD 08/27/16 1322

## 2016-08-26 NOTE — Discharge Instructions (Signed)
1. Medications: prednisone, albuterol, flonase, mucinex, tessalon, usual home medications 2. Treatment: rest, drink plenty of fluids, take tylenol or ibuprofen for fever control 3. Follow Up: Please followup with your primary doctor in 3 days for discussion of your diagnoses and further evaluation after today's visit; if you do not have a primary care doctor use the resource guide provided to find one; Return to the ER for high fevers, difficulty breathing or other concerning symptoms   Get help right away if: You have severe or persistent: Headache. Ear pain. Sinus pain. Chest pain. You have chronic lung disease and any of the following: Wheezing. Prolonged cough. Coughing up blood. A change in your usual mucus. You have a stiff neck. You have changes in your: Vision. Hearing. Thinking. Mood.

## 2016-08-26 NOTE — ED Triage Notes (Signed)
Pt states that she started feeling badly all last week. Pt reports that she has been having generalized body aches and chills. Pt was seen at [facility] Saturday morning for the same and told to follow up with her doctor. Pt is ambulatory to triage with NAD noted at this time.

## 2016-08-26 NOTE — ED Triage Notes (Signed)
Pt presents to the ED for assessment of weakness, cough, and generalized body aches.  Patient states symptoms x 2-3 weeks.  Pt has been seen by her PCP for the same, states she had a chest CT recently.  Pt states cough is unproductive.

## 2016-08-27 ENCOUNTER — Emergency Department
Admission: EM | Admit: 2016-08-27 | Discharge: 2016-08-27 | Disposition: A | Discharge status: Home / Self Care | Payer: 59 | Attending: Emergency Medicine | Admitting: Emergency Medicine

## 2016-08-27 DIAGNOSIS — R6889 Other general symptoms and signs: Secondary | ICD-10-CM

## 2016-08-27 DIAGNOSIS — B9789 Other viral agents as the cause of diseases classified elsewhere: Secondary | ICD-10-CM

## 2016-08-27 DIAGNOSIS — J069 Acute upper respiratory infection, unspecified: Secondary | ICD-10-CM

## 2016-08-27 MED ORDER — HYDROCODONE-HOMATROPINE 5-1.5 MG/5ML PO SYRP
5.0000 mL | ORAL_SOLUTION | ORAL | Status: AC
  Administered 2016-08-27: 5 mL via ORAL
  Filled 2016-08-27: qty 5

## 2016-08-27 MED ORDER — ACETAMINOPHEN 500 MG PO TABS
1000.0000 mg | ORAL_TABLET | Freq: Once | ORAL | Status: AC
  Administered 2016-08-27: 1000 mg via ORAL
  Filled 2016-08-27: qty 2

## 2016-08-27 NOTE — ED Notes (Signed)
Pt. Denies taking medication for symptoms.  Pt. States only taking aspirin for symptoms with little relief.

## 2016-08-27 NOTE — ED Provider Notes (Addendum)
[facility] Emergency Department Provider Note  ____________________________________________   First MD Initiated Contact with Patient 08/27/16 0113     (approximate)  I have reviewed the triage vital signs and the nursing notes.   HISTORY  Chief Complaint Influenza    HPI Brittney Miles is a 61 y.o. female with a history as listed below who presents for evaluation of flulike symptoms/URI that of been present for about a week.  Of note, she just left the [facility] emergency department a few hours ago (not yesterday morning as listed in triage).  She had a complete workup that did not identify any acute or emergent medical issues.  She was prescribed medications for symptomatic improvement (Tessalon,cough medication, Flonase, prednisone) and encouraged to follow up as an outpatient.  She states that she did not have the opportunity to go to the pharmacy and instead came to this emergency department.  She reports severe cough that is occasionally productive, decreased appetite, objective fever, chills, shortness of breath associated with the cough, diarrhea, all over the last week.  She reports that her symptoms are usually worse in the morning after she first gets up.  Nothing makes them better and nothing makes them worse and she describes everything as severe.  She denies dysuria, chest pain, abdominal pain, vomiting.   Past Medical History:  Diagnosis Date  . Rheumatoid arthritis [facility])     Patient Active Problem List   Diagnosis Date Noted  . Weight loss, unintentional 08/22/2016  . Cough 08/22/2016  . Smoking 08/22/2016  . Abnormal CBC 05/06/2014  . DEPRESSION, MAJOR, WITH PSYCHOTIC BEHAVIOR 06/13/2010  . Rheumatoid arthritis (HCC) 12/13/2009  . TOBACCO ABUSE 07/29/2009    Past Surgical History:  Procedure Laterality Date  . TUBAL LIGATION    . VAGINAL HYSTERECTOMY  1989   partial    Prior to Admission medications   Medication Sig Start Date  End Date Taking? Authorizing Provider  albuterol (PROVENTIL HFA;VENTOLIN HFA) 108 (90 Base) MCG/ACT inhaler Inhale 1-2 puffs into the lungs every 6 (six) hours as needed for wheezing or shortness of breath. 08/26/16   Francisco [address] [place], [place]  aspirin 325 MG tablet Take 325 mg by mouth every 4 (four) hours as needed for mild pain.    Historical Provider, MD  benzonatate (TESSALON) 100 MG capsule Take 1 capsule (100 mg total) by mouth 3 (three) times daily as needed for cough. 08/26/16   Francisco Manuel [place], [place]  dextromethorphan-guaiFENesin (MUCINEX DM) 30-600 MG 12hr tablet Take 1 tablet by mouth 2 (two) times daily as needed for cough. 08/26/16   Francisco Manuel [place], [place]  fluticasone (FLONASE) 50 MCG/ACT nasal spray Place 2 sprays into both nostrils daily. 08/26/16   Francisco Manuel [place], [place]  predniSONE (STERAPRED UNI-PAK 21 TAB) 10 MG (21) TBPK tablet Take 1 tablet (10 mg total) by mouth daily. Take 6 tabs day 1, 5 tabs day 2, 4 tabs day 3, 3 tabs day 4, 2 tabs day 5, and 1 on day 6 08/26/16   [facility] [place], [place]    Allergies Penicillins and Sulfonamide derivatives  Family History  Problem Relation Age of Onset  . Diabetes Mother   . Coronary artery disease Father     s/p MI at age 58  . Cancer Sister 39    ovarian     Social History Social History  Substance Use Topics  . Smoking status: Current Every Day Smoker    Packs/day: 1.00    Years:  30.00    Types: Cigarettes  . Smokeless tobacco: Never Used  . Alcohol use Yes     Comment: 3-8 beers per day    Review of Systems Constitutional: Subjective fever/chills, myalgias and general malaise Eyes: No visual changes. ENT: Nasal congestion, runny nose, mild sore throat Cardiovascular: Denies chest pain. Respiratory: Persistent productive cough and associated shortness of breath Gastrointestinal: No abdominal pain.  nausea, no vomiting.  +diarrhea.  No constipation. Genitourinary: Negative for  dysuria. Musculoskeletal: Negative for back pain. Skin: Negative for rash. Neurological: Negative for headaches, focal weakness or numbness.  10-point ROS otherwise negative.  ____________________________________________   PHYSICAL EXAM:  VITAL SIGNS: ED Triage Vitals  Enc Vitals Group     BP 08/26/16 2356 (!) 149/97     Pulse Rate 08/26/16 2356 99     Resp 08/26/16 2356 16     Temp 08/26/16 2356 97.9 F (36.6 C)     Temp Source 08/26/16 2356 Oral     SpO2 08/26/16 2356 100 %     Weight 08/26/16 2357 102 lb (46.3 kg)     Height 08/26/16 2357 5\' 2"  (1.575 m)     Head Circumference --      Peak Flow --      Pain Score 08/26/16 2357 7     Pain Loc --      Pain Edu? --      Excl. in GC? --     Constitutional: Alert and oriented. Appears ill with viral symptoms but nontoxic Eyes: Conjunctivae are normal. PERRL. EOMI. Head: Atraumatic. Nose: +congestion/rhinnorhea. Mouth/Throat: Mucous membranes are moist.  Oropharynx non-erythematous.  No exudate. Neck: No stridor.  No meningeal signs.   Cardiovascular: Normal rate, regular rhythm. Good peripheral circulation. Grossly normal heart sounds. Respiratory: Normal respiratory effort.  No retractions. Lungs CTAB.  Persistent cough during evaluation Gastrointestinal: Soft and nontender. No distention.  Musculoskeletal: No lower extremity tenderness nor edema. No gross deformities of extremities. Neurologic:  Normal speech and language. No gross focal neurologic deficits are appreciated.  Skin:  Skin is warm, dry and intact. No rash noted. Psychiatric: Mood and affect are normal. Speech and behavior are normal.  ____________________________________________   LABS (all labs ordered are listed, but only abnormal results are displayed)  Labs Reviewed - No data to display ____________________________________________  EKG  None - EKG not ordered by ED  physician ____________________________________________  RADIOLOGY  (obtained several hours ago at [facility]) Dg Chest 2 View  Result Date: 08/26/2016 CLINICAL DATA:  Subacute onset of generalized weakness, cough and body aches. High blood pressure. Initial encounter. EXAM: CHEST  2 VIEW COMPARISON:  Chest radiograph performed 08/22/2016, and CT of the chest performed 08/24/2016 FINDINGS: The lungs are hyperexpanded, with flattening of the hemidiaphragms, compatible with COPD. Bulla are noted at the right lung apex. Known right-sided pulmonary nodules are not well characterized on radiograph. Left apical scarring is seen. Peribronchial thickening is noted. There is no evidence of pleural effusion or pneumothorax. Likely bilateral nipple shadows are noted. The heart is normal in size; the mediastinal contour is within normal limits. No acute osseous abnormalities are seen. IMPRESSION: Peribronchial thickening noted. Findings of COPD. Known right-sided pulmonary nodules are not well characterized on radiograph. Electronically Signed   By: 08/26/2016 M.D.   On: 08/26/2016 20:08    ____________________________________________   PROCEDURES  Procedure(s) performed:   Procedures   Critical Care performed: No ____________________________________________   INITIAL IMPRESSION / ASSESSMENT AND PLAN / ED COURSE  Pertinent labs & imaging results that were available during my care of the patient were reviewed by me and considered in my medical decision making (see chart for details).  Explained to the patient that she had a thorough workup at [facility] a few hours ago and reiterated URI recommendations and return precautions.  I encouraged her to fill the prescriptions she was provided and follow-up with her regular doctor.  She understands and agrees.  I gave her a dose of Tylenol and cough medicine while she is here.  there is no indication of any acute or emergent medical condition at this  time.      ____________________________________________  FINAL CLINICAL IMPRESSION(S) / ED DIAGNOSES  Final diagnoses:  Flu-like symptoms  Viral URI with cough     MEDICATIONS GIVEN DURING THIS VISIT:  Medications  HYDROcodone-homatropine (HYCODAN) 5-1.5 MG/5ML syrup 5 mL (not administered)  acetaminophen (TYLENOL) tablet 1,000 mg (not administered)     NEW OUTPATIENT MEDICATIONS STARTED DURING THIS VISIT:  New Prescriptions   No medications on file    Modified Medications   No medications on file    Discontinued Medications   No medications on file     Note:  This document was prepared using Dragon voice recognition software and may include unintentional dictation errors.    Loleta Rose, MD 08/27/16 2248    Loleta Rose, MD 08/27/16 [phone]

## 2016-08-27 NOTE — Discharge Instructions (Signed)
You have been seen in the Emergency Department (ED) today for a likely viral illness.  Please drink plenty of clear fluids (water, Gatorade, chicken broth, etc).  You may use Tylenol and/or Motrin according to label instructions.  You can alternate between the two without any side effects.  Take the medications you were prescribed a few hours ago at [facility]; those medications should help with your symptoms, although they will not make them go away completely.  Please follow up with your doctor as listed above.  Call your doctor or return to the Emergency Department (ED) if you are unable to tolerate fluids due to vomiting, have worsening trouble breathing, become extremely tired or difficult to awaken, or if you develop any other symptoms that concern you.

## 2016-08-27 NOTE — ED Notes (Signed)
Pt. States she has had flu like symptoms for over a week.  Pt. States worse in the past week.  Pt. States productive cough, body aches.

## 2016-08-27 NOTE — ED Notes (Signed)
Pt. Going home with husband. 

## 2017-05-10 DEATH — deceased

## 2018-01-23 IMAGING — CT CT CHEST W/ CM
2 of 3 series · 15 of 36 positions shown, 18 images · IV contrast (ISOVUE 300)
Comparison: Radiographs August 22, 2016.

CLINICAL DATA: Weight loss, productive cough.

EXAM:
CT CHEST WITH CONTRAST
TECHNIQUE: Multidetector CT imaging of the chest was performed during
intravenous contrast administration.
CONTRAST:  70mL [id number] IOPAMIDOL ([id number]) INJECTION 61%

[Series 2: thorax · axial · 0.57mm/px · z∈[-300,-22]mm · 12 of 165 slices shown, 15 images]
[im 13/165  mediastinal]
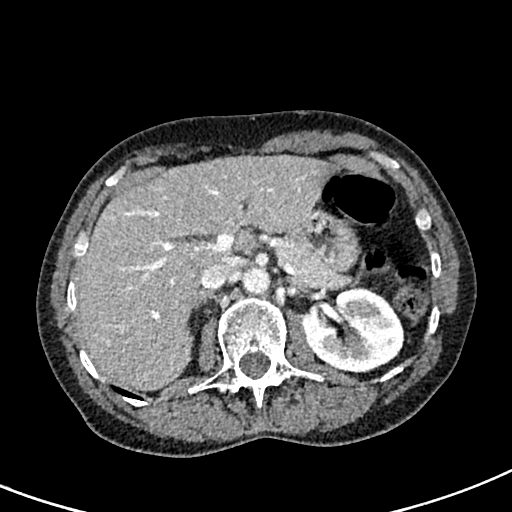
[im 13/165  lung]
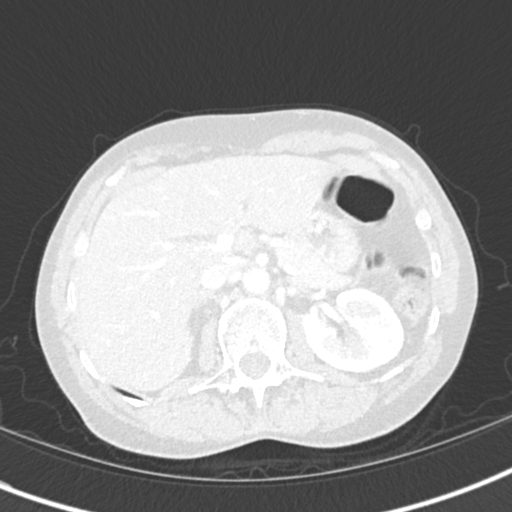
[im 25/165  lung]
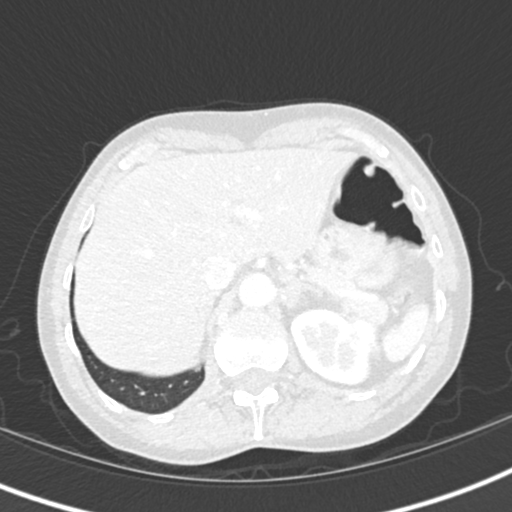
[im 37/165  lung]
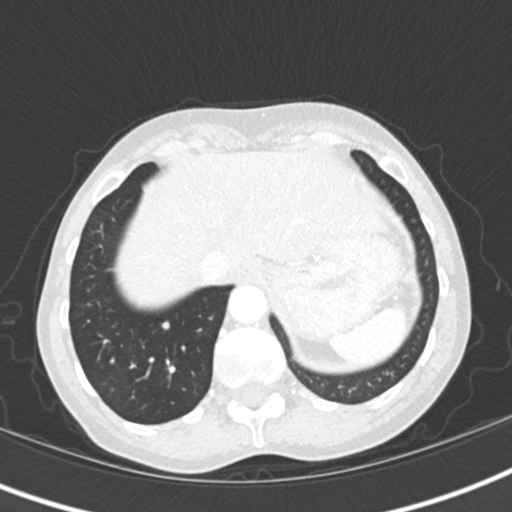
[im 49/165  lung]
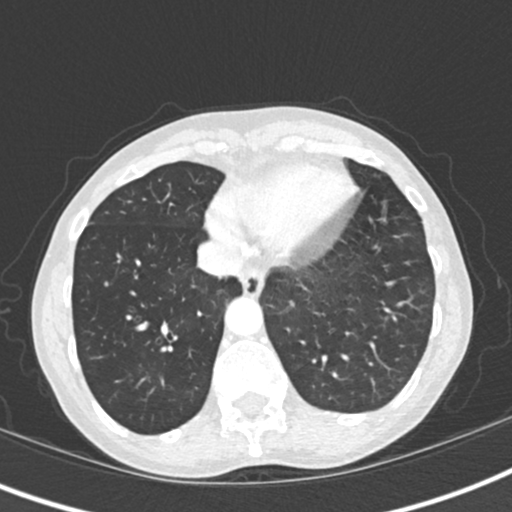
[im 61/165  mediastinal]
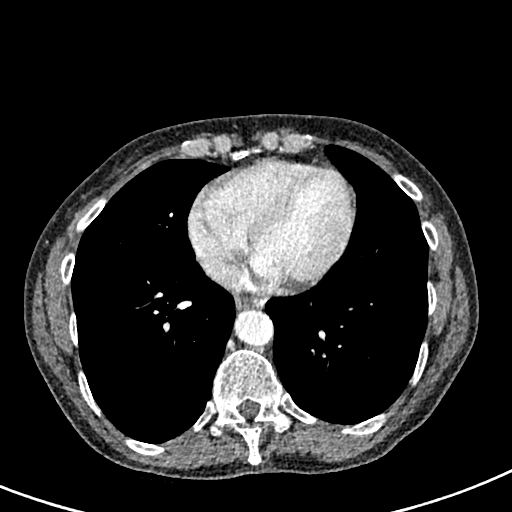
[im 61/165  lung]
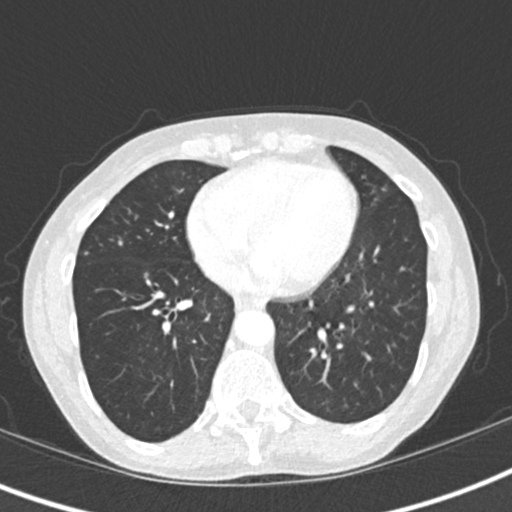
[im 73/165  lung]
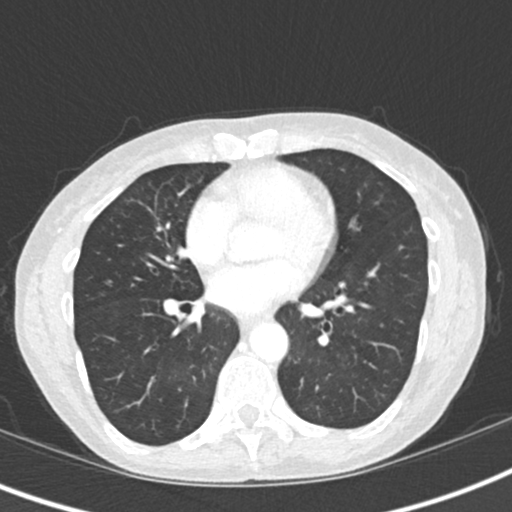
[im 92/165  lung]
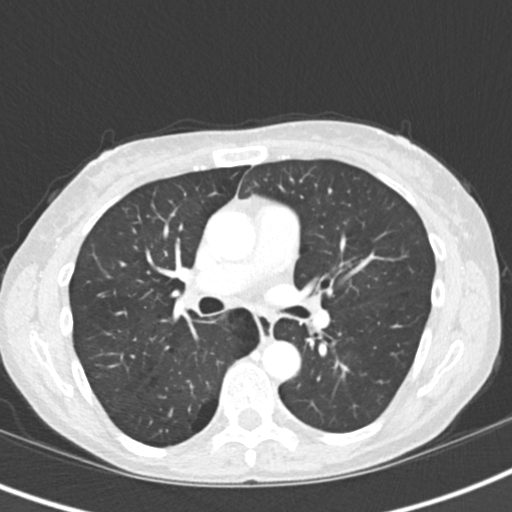
[im 104/165  lung]
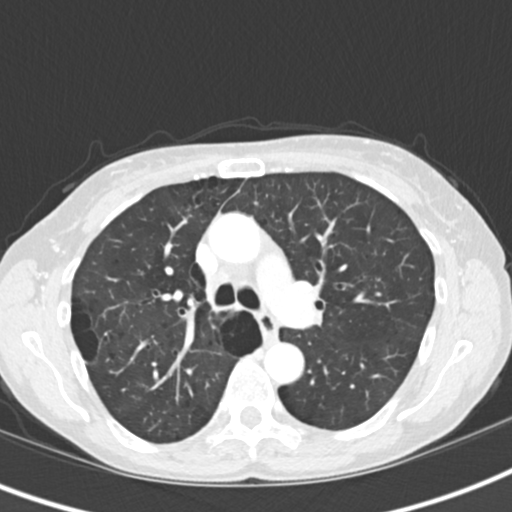
[im 116/165  mediastinal]
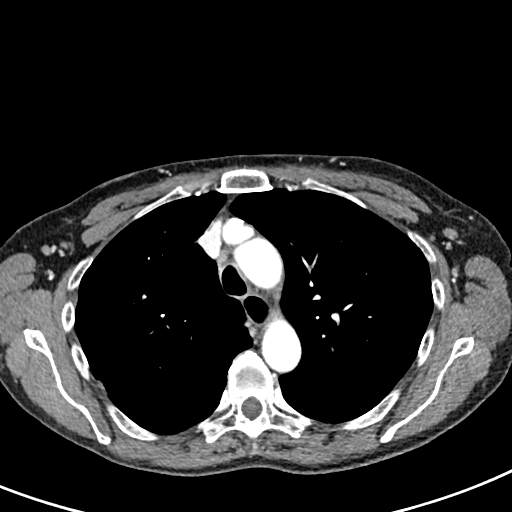
[im 116/165  lung]
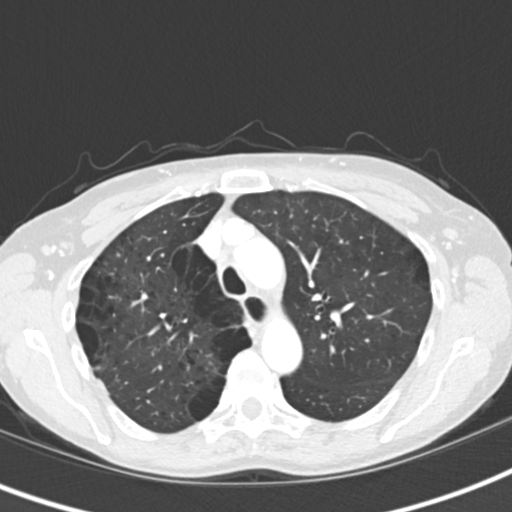
[im 128/165  lung]
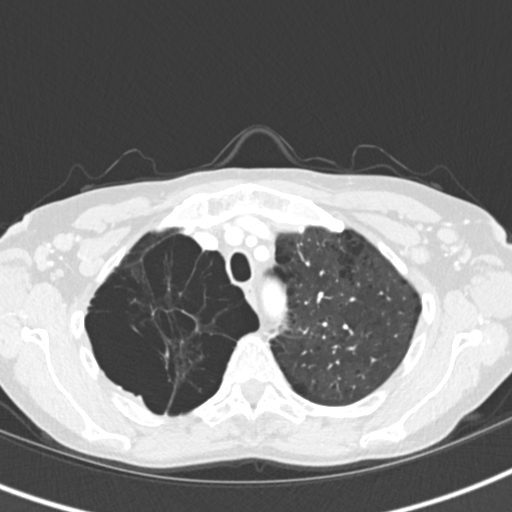
[im 140/165  lung]
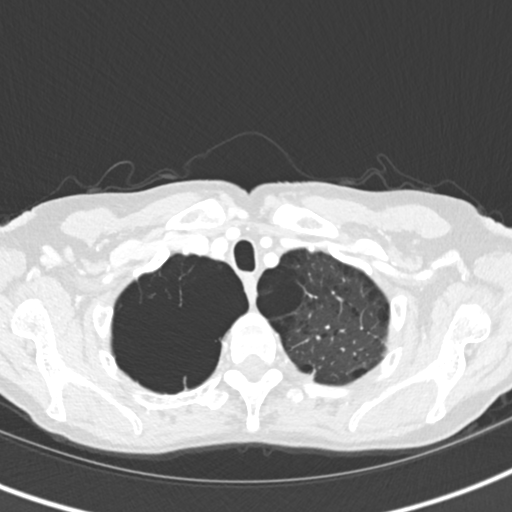
[im 152/165  lung]
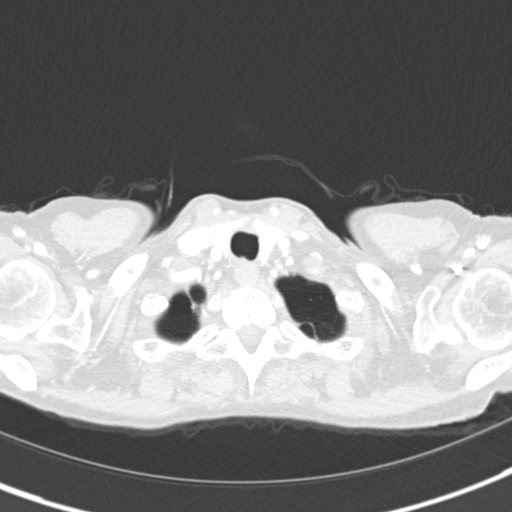

[Series 5: coronal · coronal · 0.53mm/px · 3 of 100 slices shown]
[im 20/100  lung]
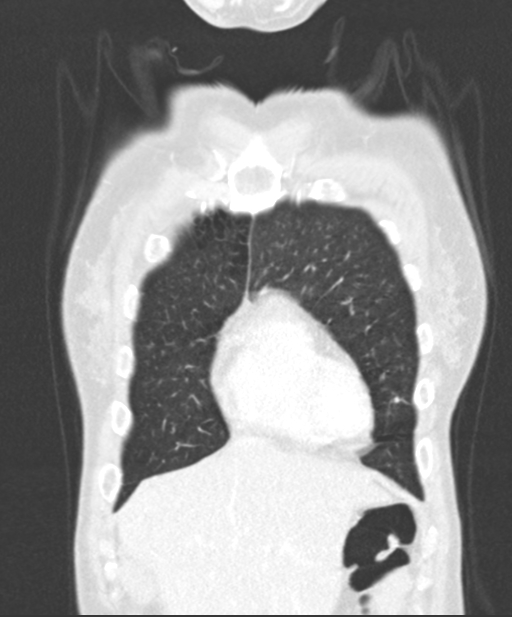
[im 40/100  lung]
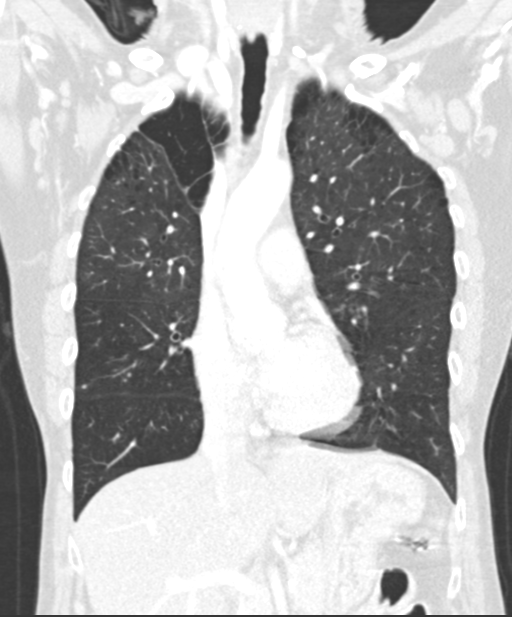
[im 60/100  lung]
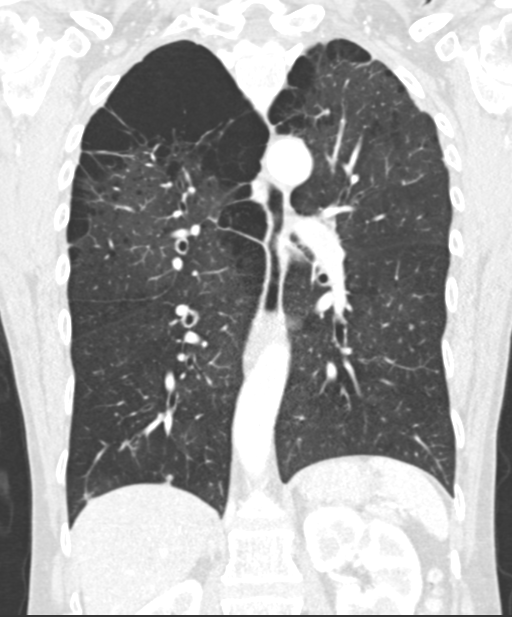

[15 of 36 positions shown; findings below may reference images not displayed]

FINDINGS: Cardiovascular: No significant vascular findings. Normal heart size.
No pericardial effusion.

Mediastinum/Nodes: Thyroid gland is unremarkable. No mediastinal
adenopathy is noted. Mild bilateral axillary adenopathy is noted
with the largest measuring 14 mm in the left axillary region.

Lungs/Pleura: No pneumothorax or pleural effusion is noted.
\Emphysematous disease is noted in both upper lobes, right greater
than left. Probable scarring is seen posteriorly in the right upper
lobe. Multiple small nodules are noted in the right lung base, with
the largest measuring 5 mm in right posterior costophrenic sulcus
best seen on image number 134 series 3.

Upper Abdomen: No acute abnormality.

Musculoskeletal: No chest wall abnormality. No acute or significant
osseous findings.
IMPRESSION: Mild bilateral axillary adenopathy is noted. This may simply be
inflammatory or reactive in etiology, but neoplasm or malignancy
cannot be excluded.

Emphysematous disease is noted in both upper lobes, right greater
than left.

Multiple small modules are noted in the right lung, with the largest
measuring 5 mm. No follow-up needed if patient is low-risk (and has
no known or suspected primary neoplasm). Non-contrast chest CT can
be considered in 12 months if patient is high-risk. This
recommendation follows the consensus statement: Guidelines for
Management of Incidental Pulmonary Nodules Detected on CT Images:

## 2018-01-25 IMAGING — CR DG CHEST 2V
2 series · 2 of 2 positions shown · non-contrast
Comparison: Chest radiograph performed 08/22/2016, and CT of the
chest performed 08/24/2016

CLINICAL DATA: Subacute onset of generalized weakness, cough and
body aches. High blood pressure. Initial encounter.

EXAM:
CHEST  2 VIEW

[chest pa]
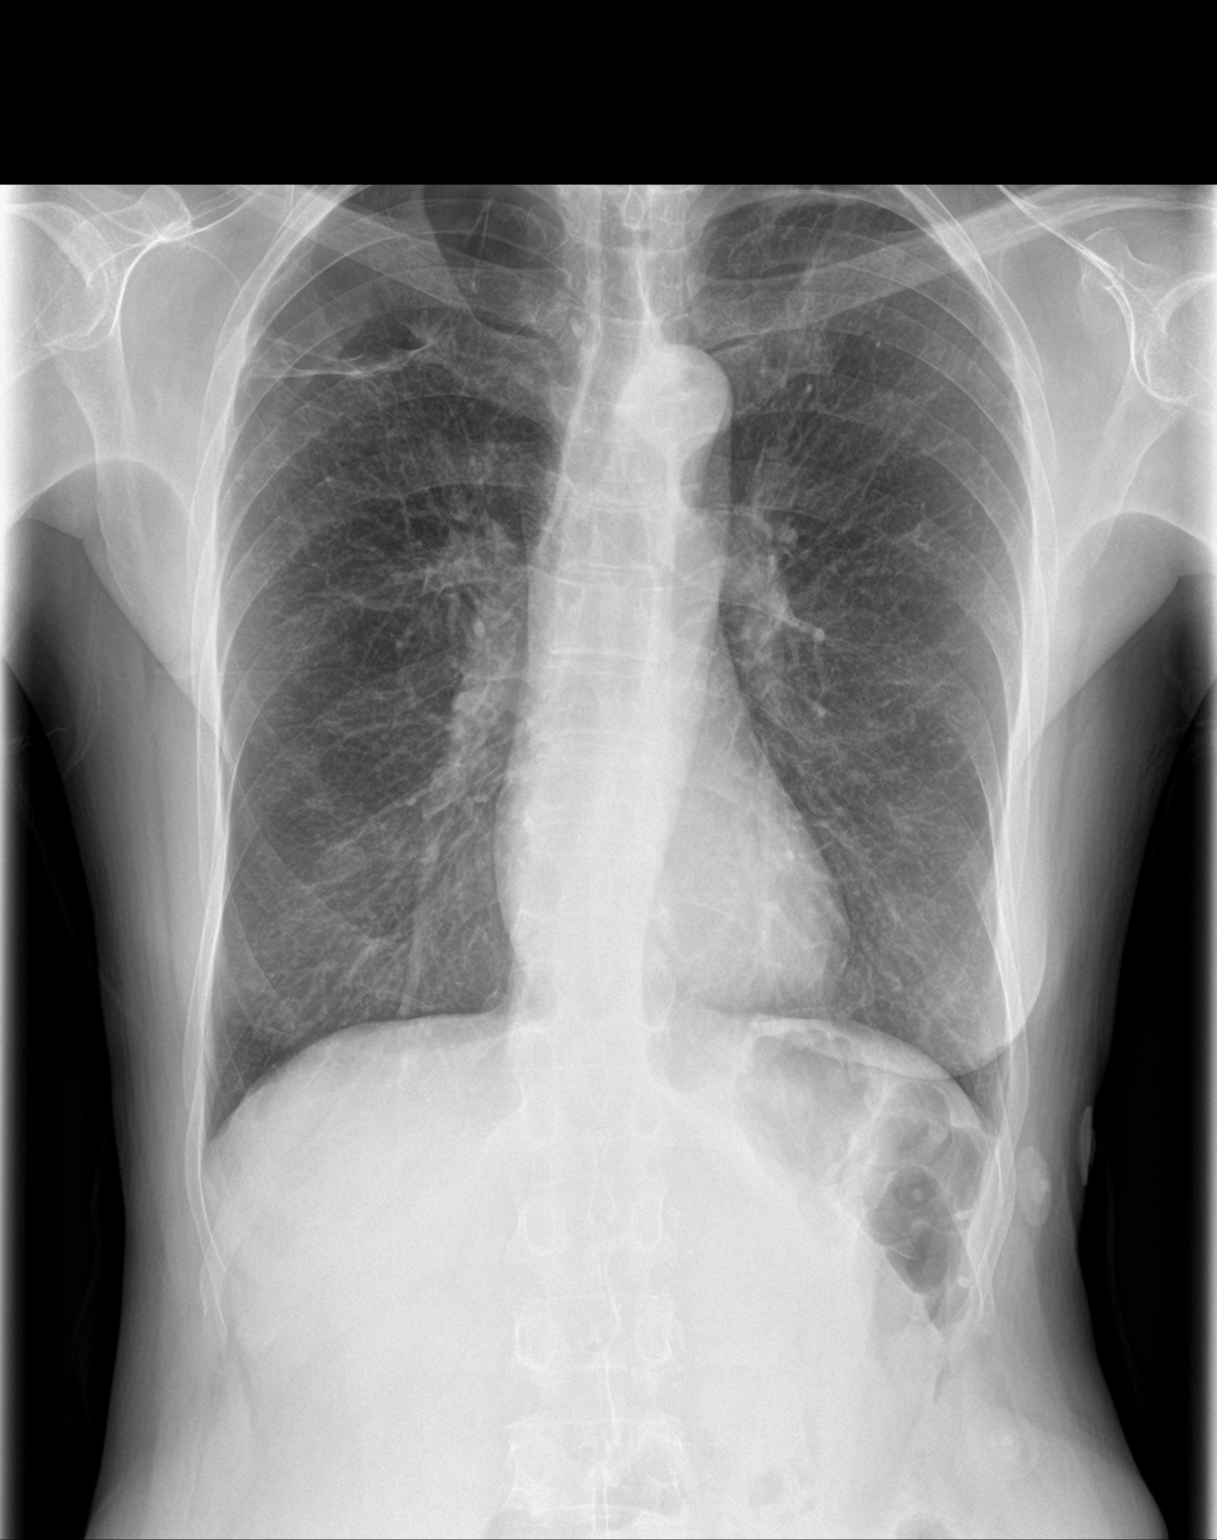

[chest lat]
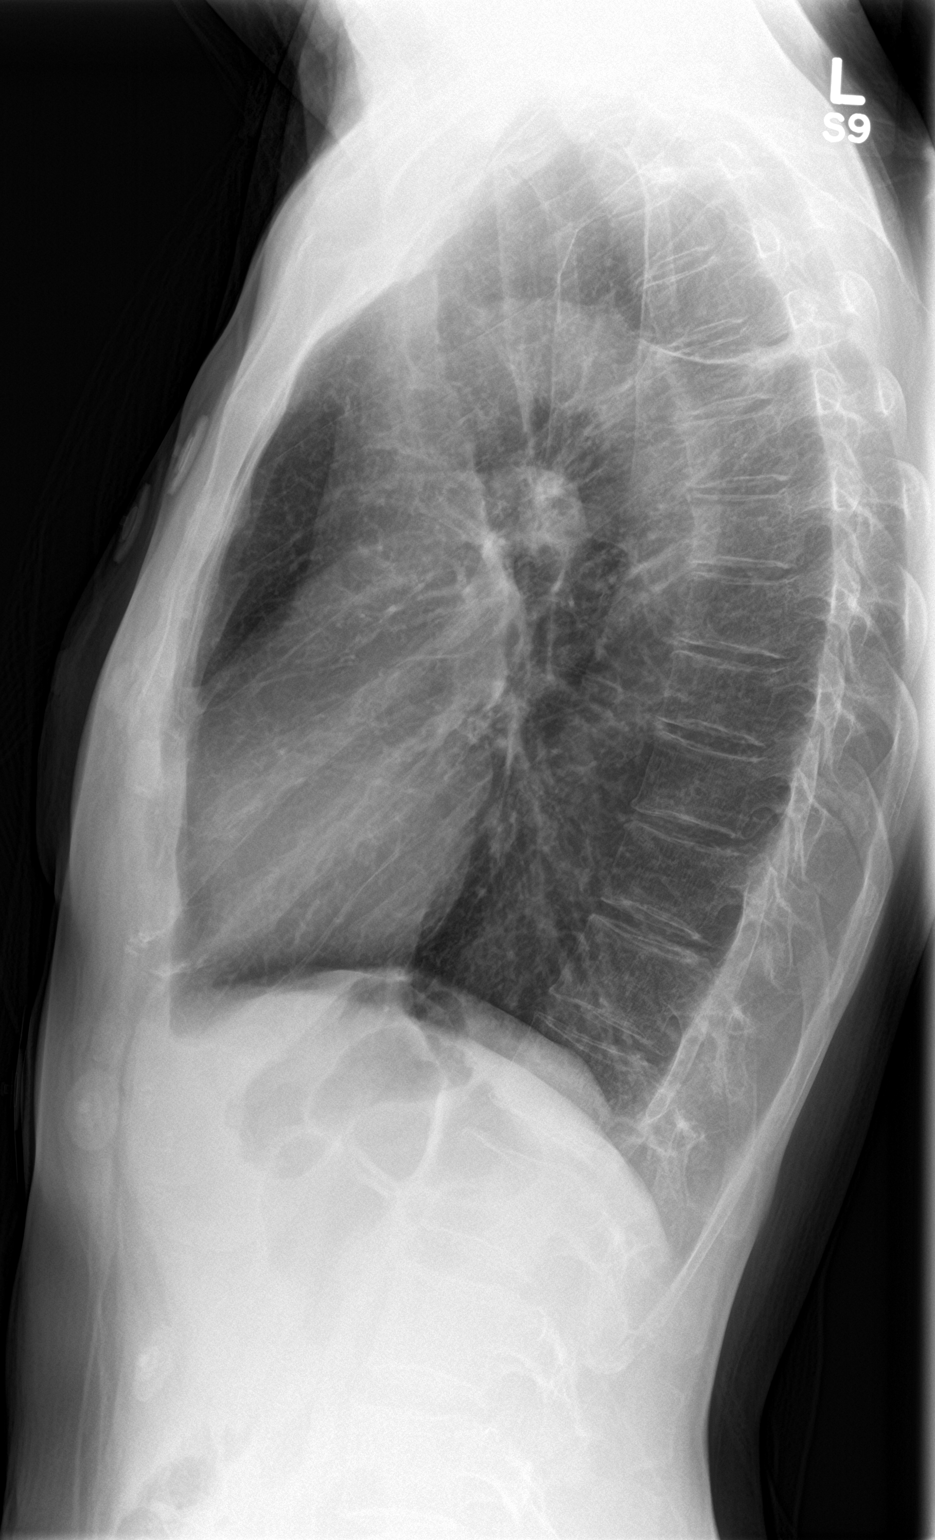

[2 of 2 positions shown; findings below may reference images not displayed]

FINDINGS: The lungs are hyperexpanded, with flattening of the hemidiaphragms,
compatible with COPD. Bulla are noted at the right lung apex. Known
right-sided pulmonary nodules are not well characterized on
radiograph. Left apical scarring is seen. Peribronchial thickening
is noted. There is no evidence of pleural effusion or pneumothorax.
Likely bilateral nipple shadows are noted.

The heart is normal in size; the mediastinal contour is within
normal limits. No acute osseous abnormalities are seen.
IMPRESSION: Peribronchial thickening noted. Findings of COPD. Known right-sided
pulmonary nodules are not well characterized on radiograph.
